# Patient Record
Sex: Female | Born: 1995
Health system: Southern US, Community
[De-identification: ages and names within clinical notes are randomized; demographics above are authoritative.]

## PROBLEM LIST (undated history)

## (undated) DIAGNOSIS — T7840XA Allergy, unspecified, initial encounter: Secondary | ICD-10-CM

## (undated) HISTORY — PX: WISDOM TOOTH EXTRACTION: SHX21

## (undated) HISTORY — DX: Allergy, unspecified, initial encounter: T78.40XA

---
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
## 2016-09-07 DIAGNOSIS — D225 Melanocytic nevi of trunk: Secondary | ICD-10-CM | POA: Diagnosis not present

## 2016-09-07 DIAGNOSIS — D224 Melanocytic nevi of scalp and neck: Secondary | ICD-10-CM | POA: Diagnosis not present

## 2016-10-04 DIAGNOSIS — D224 Melanocytic nevi of scalp and neck: Secondary | ICD-10-CM | POA: Diagnosis not present

## 2016-10-10 DIAGNOSIS — Z6823 Body mass index (BMI) 23.0-23.9, adult: Secondary | ICD-10-CM | POA: Diagnosis not present

## 2016-10-10 DIAGNOSIS — Z01419 Encounter for gynecological examination (general) (routine) without abnormal findings: Secondary | ICD-10-CM | POA: Diagnosis not present

## 2016-10-10 LAB — HM PAP SMEAR: HM Pap smear: NEGATIVE

## 2017-03-22 DIAGNOSIS — J45909 Unspecified asthma, uncomplicated: Secondary | ICD-10-CM | POA: Diagnosis not present

## 2017-04-08 ENCOUNTER — Ambulatory Visit: Payer: Self-pay | Admitting: Family Medicine

## 2017-04-17 ENCOUNTER — Encounter: Payer: Self-pay | Admitting: Family Medicine

## 2017-04-17 ENCOUNTER — Ambulatory Visit (INDEPENDENT_AMBULATORY_CARE_PROVIDER_SITE_OTHER): Payer: 59 | Admitting: Family Medicine

## 2017-04-17 VITALS — BP 130/84 | HR 101 | Ht 67.25 in | Wt 150.0 lb

## 2017-04-17 DIAGNOSIS — M76899 Other specified enthesopathies of unspecified lower limb, excluding foot: Secondary | ICD-10-CM

## 2017-04-17 DIAGNOSIS — J45990 Exercise induced bronchospasm: Secondary | ICD-10-CM | POA: Diagnosis not present

## 2017-04-17 MED ORDER — MELOXICAM 7.5 MG PO TABS: 7.5000 mg | ORAL_TABLET | Freq: Every day | ORAL | 0 refills | Status: DC

## 2017-04-17 NOTE — Progress Notes (Signed)
Subjective:  Katie Oliver is a 21 y.o. female who presents today with a chief complaint of the pain and to establish care.   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  She was to go to graduate school afterwards.  Was to social work.  She is currently home and things can work.  Knee pain, new issue Symptoms started about 3 weeks ago.  Stable over that time.  She is in ballet class at school, however does not have any other obvious precipitating events.  No trauma   Or injury.  Pain is located in the superior anterior aspect of her knee.  No locking, popping, or catching.  No swelling.  No treatments tried.  Shortness of breath, new issue Patient had an episode about a month ago of increased shortness of breath while hiking.  She was seen in a different office and was given a prescription for inhaler.  She has not yet started this.  She did have some chest tightness and decreased airflow with episode.  No chest pain.  No cough.  She has not tried any other treatments for this.  She does report a several year history of increased shortness of breath with exercise.  She has never been diagnosed with asthma in the past.  ROS: Per HPI, otherwise a 14 point review of systems was performed and was negative  PMH:  The following were reviewed and entered/updated in epic: Past Medical History:  Diagnosis Date  . Allergy    Patient Active Problem List   Diagnosis Date Noted  . Exercise-induced asthma 04/17/2017   Past Surgical History:  Procedure Laterality Date  . WISDOM TOOTH EXTRACTION     Family History  Problem Relation Age of Onset  . Cancer Maternal Grandmother        Skin   . Cancer Maternal Grandfather        Prostate  . Heart attack Paternal Grandfather   . Diabetes Paternal Grandfather     Medications- reviewed and updated Current Outpatient Medications  Medication Sig Dispense Refill  . JUNEL FE 1/20 1-20 MG-MCG  tablet Take 1 tablet by mouth daily.  3  . meloxicam (MOBIC) 7.5 MG tablet Take 1 tablet (7.5 mg total) by mouth daily. 30 tablet 0  . VENTOLIN HFA 108 (90 Base) MCG/ACT inhaler      No current facility-administered medications for this visit.    Allergies-reviewed and updated No Known Allergies  Social History   Socioeconomic History  . Marital status: Single    Spouse name: None  . Number of children: None  . Years of education: None  . Highest education level: None  Social Needs  . Financial resource strain: None  . Food insecurity - worry: None  . Food insecurity - inability: None  . Transportation needs - medical: None  . Transportation needs - non-medical: None  Occupational History  . Occupation: Ship broker   Tobacco Use  . Smoking status: Never Smoker  . Smokeless tobacco: Never Used  Substance and Sexual Activity  . Alcohol use: No    Frequency: Never  . Drug use: No  . Sexual activity: Yes  Other Topics Concern  . None  Social History Narrative  . None   Objective:  Physical Exam: BP 130/84   Pulse (!) 101   Ht 5' 7.25" (1.708 m)   Wt 150 lb (68 kg)   BMI 23.32 kg/m   Gen: NAD, resting  comfortably CV: RRR with no murmurs appreciated Pulm: NWOB, CTAB with no crackles, wheezes, or rhonchi GI: Normal bowel sounds present. Soft, Nontender, Nondistended. MSK:  -Right knee: No deformities.  Full range of motion.  No crepitus.  Tender to palpation along superior edge patella.  Anterior and posterior drawer signs negative.  Stable to varus and valgus stress.  Strength 5 out of 5 in all directions.  Negative patellar apprehension test. Skin: Warm, dry Neuro: Grossly normal, moves all extremities Psych: Normal affect and thought content  Assessment/Plan:  Quadricep tendinitis Start meloxicam 7.5 mg daily for the next 2 weeks.  Discussed home exercise program focused on strengthening quadricep muscle and tendon.  Advised compression and ice.  Discussed avoidance  of activity that worsens pain.  Return precautions reviewed.  Follow-up as needed.  Exercise-induced asthma Based on history.  Her exam is completely normal.  She does not have PFTs for formal diagnosis.  Advised patient to use albuterol inhaler as needed about 30 minutes prior to activity.  Patient can return for PFTs at her discretion-do not need urgently as it will not change management.  Return precautions reviewed.  Follow-up as needed.  Algis Greenhouse. Jerline Pain, MD 04/17/2017 2:39 PM

## 2017-04-17 NOTE — Patient Instructions (Signed)
Use the meloxicam for the next 2 weeks.  Take 1 pill daily.  Not take any ibuprofen or naproxen while on this.  Do the exercises we discussed.  Use the albuterol prior to exercise.  We can do a lung function test in the future for a formal diagnosis of asthma.  He can use compression under knee as needed.  Take care, Dr. Jerline Pain

## 2017-05-13 ENCOUNTER — Ambulatory Visit (INDEPENDENT_AMBULATORY_CARE_PROVIDER_SITE_OTHER): Payer: 59 | Admitting: Family Medicine

## 2017-05-13 DIAGNOSIS — Z23 Encounter for immunization: Secondary | ICD-10-CM

## 2017-05-13 DIAGNOSIS — Z111 Encounter for screening for respiratory tuberculosis: Secondary | ICD-10-CM

## 2017-05-15 ENCOUNTER — Ambulatory Visit: Payer: 59

## 2017-05-15 ENCOUNTER — Encounter: Payer: Self-pay | Admitting: Family Medicine

## 2017-05-15 DIAGNOSIS — Z111 Encounter for screening for respiratory tuberculosis: Secondary | ICD-10-CM | POA: Diagnosis not present

## 2017-05-15 LAB — TB SKIN TEST
Induration: NEGATIVE mm
TB Skin Test: NEGATIVE

## 2017-05-15 NOTE — Progress Notes (Signed)
Normal/negative PPD read

## 2017-05-15 NOTE — Progress Notes (Signed)
I have reviewed the patient's encounter and agree with the documentation.  Algis Greenhouse. Jerline Pain, MD 05/15/2017 9:25 AM

## 2017-05-15 NOTE — Progress Notes (Signed)
PPD read, normal.

## 2017-05-23 NOTE — Progress Notes (Signed)
Patient needing PPD placement for work purposes.  PPD placed.  Patient tolerated well.

## 2017-06-25 ENCOUNTER — Encounter: Payer: Self-pay | Admitting: Physical Therapy

## 2017-07-31 ENCOUNTER — Telehealth: Payer: Self-pay | Admitting: Family Medicine

## 2017-07-31 NOTE — Telephone Encounter (Signed)
Per [facility] the patient called to check if her Immunization form was ready for pick up. She dropped it off a couple days ago.

## 2017-08-01 NOTE — Telephone Encounter (Signed)
Form has been filled out and signed.  Placed at front for pick up by patient or one of her parents.  Patient verbalized permission for a parent to pick up.

## 2017-10-14 DIAGNOSIS — Z01419 Encounter for gynecological examination (general) (routine) without abnormal findings: Secondary | ICD-10-CM | POA: Diagnosis not present

## 2017-10-14 DIAGNOSIS — Z6823 Body mass index (BMI) 23.0-23.9, adult: Secondary | ICD-10-CM | POA: Diagnosis not present

## 2017-10-24 DIAGNOSIS — M25571 Pain in right ankle and joints of right foot: Secondary | ICD-10-CM | POA: Diagnosis not present

## 2018-03-14 ENCOUNTER — Encounter: Payer: Self-pay | Admitting: Family Medicine

## 2018-03-14 ENCOUNTER — Ambulatory Visit (INDEPENDENT_AMBULATORY_CARE_PROVIDER_SITE_OTHER): Payer: 59 | Admitting: Family Medicine

## 2018-03-14 VITALS — BP 132/80 | HR 85 | Temp 98.3°F | Wt 149.0 lb

## 2018-03-14 DIAGNOSIS — J310 Chronic rhinitis: Secondary | ICD-10-CM

## 2018-03-14 DIAGNOSIS — M2550 Pain in unspecified joint: Secondary | ICD-10-CM | POA: Diagnosis not present

## 2018-03-14 DIAGNOSIS — Z7184 Encounter for health counseling related to travel: Secondary | ICD-10-CM | POA: Diagnosis not present

## 2018-03-14 DIAGNOSIS — R109 Unspecified abdominal pain: Secondary | ICD-10-CM | POA: Insufficient documentation

## 2018-03-14 LAB — COMPREHENSIVE METABOLIC PANEL
ALT: 13 U/L (ref 0–35)
AST: 14 U/L (ref 0–37)
Albumin: 4.2 g/dL (ref 3.5–5.2)
Alkaline Phosphatase: 56 U/L (ref 39–117)
BUN: 12 mg/dL (ref 6–23)
CHLORIDE: 106 meq/L (ref 96–112)
CO2: 28 meq/L (ref 19–32)
CREATININE: 1.04 mg/dL (ref 0.40–1.20)
Calcium: 9.5 mg/dL (ref 8.4–10.5)
GFR: 70.03 mL/min (ref >60.00)
Glucose, Bld: 84 mg/dL (ref 70–99)
POTASSIUM: 4.6 meq/L (ref 3.5–5.1)
SODIUM: 141 meq/L (ref 135–145)
Total Bilirubin: 0.6 mg/dL (ref 0.2–1.2)
Total Protein: 6.8 g/dL (ref 6.0–8.3)

## 2018-03-14 LAB — CBC
HEMATOCRIT: 40.3 % (ref 36.0–46.0)
Hemoglobin: 13.6 g/dL (ref 12.0–15.0)
MCHC: 33.8 g/dL (ref 30.0–36.0)
MCV: 87.7 fl (ref 78.0–100.0)
Platelets: 351 10*3/uL (ref 150.0–400.0)
RBC: 4.59 Mil/uL (ref 3.87–5.11)
RDW: 12.5 % (ref 11.5–15.5)
WBC: 5.9 10*3/uL (ref 4.0–10.5)

## 2018-03-14 LAB — SEDIMENTATION RATE: Sed Rate: 1 mm/hr (ref 0–20)

## 2018-03-14 LAB — C-REACTIVE PROTEIN: CRP: 0.3 mg/dL — AB (ref 0.5–20.0)

## 2018-03-14 LAB — TSH: TSH: 1.59 u[IU]/mL (ref 0.35–4.50)

## 2018-03-14 MED ORDER — AZITHROMYCIN 250 MG PO TABS: ORAL_TABLET | ORAL | 0 refills | Status: DC

## 2018-03-14 MED ORDER — TYPHOID VACCINE PO CPDR: 1.0000 | DELAYED_RELEASE_CAPSULE | ORAL | 0 refills | Status: DC

## 2018-03-14 NOTE — Progress Notes (Signed)
   Subjective:  Katie Oliver is a 22 y.o. female who presents today with a chief complaint of polyarthralgia.   HPI:   Polyarthralgia, new problem Several year history.  Has had issues of "discomfort" in several joints including shoulders, hips, knees, back, and chest.  Does not currently have any symptoms.  Symptoms usually last for a day or so and then subside.  Occasionally the areas feel warm.  She has not tried any medications for this.  No fevers or chills.  No weight loss.  Chronic rhinitis Several year history.  Occasionally gets sinus infections once or twice per year.  Like to see an allergist for allergy testing.  Abdominal pain Also several year history.  Worse with certain foods.  Pain improves with bowel movement.  No nausea or vomiting.  No constipation or diarrhea.  Travel medicine Patient will be going to [place] 2 months.  She will be there for a week.  ROS: Per HPI  PMH: She reports that she has never smoked. She has never used smokeless tobacco. She reports that she does not drink alcohol or use drugs.  Objective:  Physical Exam: BP 132/80 (BP Location: Right Arm, Patient Position: Sitting, Cuff Size: Normal)   Pulse 85   Temp 98.3 F (36.8 C) (Oral)   Wt 149 lb (67.6 kg)   SpO2 100%   BMI 23.16 kg/m   Gen: NAD, resting comfortably CV: RRR with no murmurs appreciated Pulm: NWOB, CTAB with no crackles, wheezes, or rhonchi GI: Normal bowel sounds present. Soft, Nontender, Nondistended. MSK: No edema, cyanosis, or clubbing noted Skin: Warm, dry Neuro: Grossly normal, moves all extremities Psych: Normal affect and thought content   Assessment/Plan:  Polyarthralgia No red flag signs or symptoms.  Mother is concerned about autoimmune disorder.  Check CBC, CMET, ANA, CRP, rheumatoid factor, TSH, and sedimentation rate.  Chronic rhinitis Continue over-the-counter antihistamines as needed.  Will place referral to allergy for allergy testing, per patient  request.  Abdominal pain Likely secondary to IBS.  No red flags.  Travel Advice Typhoid vaccine sent into her pharmacy.  We will also send in Z-Pak for traveler's diarrhea.  Patient declined malaria prophylaxis.  Algis Greenhouse. Jerline Pain, MD 03/14/2018 12:27 PM

## 2018-03-14 NOTE — Assessment & Plan Note (Signed)
Likely secondary to IBS.  No red flags.

## 2018-03-14 NOTE — Patient Instructions (Signed)
It was very nice to see you today!  We will check blood work today and send in a referral to the allergist.  Please take the typhoid vaccine.  Please use the azithromycin if you get traveler's diarrhea. You can also use immodium.   Take care, Dr Jerline Pain

## 2018-03-14 NOTE — Assessment & Plan Note (Signed)
No red flag signs or symptoms.  Mother is concerned about autoimmune disorder.  Check CBC, CMET, ANA, CRP, rheumatoid factor, TSH, and sedimentation rate.

## 2018-03-14 NOTE — Assessment & Plan Note (Signed)
Continue over-the-counter antihistamines as needed.  Will place referral to allergy for allergy testing, per patient request.

## 2018-03-17 LAB — RHEUMATOID FACTOR: Rhuematoid fact SerPl-aCnc: <14 IU/mL (ref <14)

## 2018-03-17 LAB — ANA: Anti Nuclear Antibody(ANA): NEGATIVE

## 2018-03-17 NOTE — Progress Notes (Signed)
Please inform patient of the following:  Please let patient know that all of her inflammatory markers were negative. I do not have a clear explanation for her joint pain based on these results. I would like for her to see sports medicine for further evaluation and management.  Algis Greenhouse. Jerline Pain, MD 03/17/2018 3:35 PM

## 2018-03-18 ENCOUNTER — Other Ambulatory Visit: Payer: Self-pay

## 2018-03-18 DIAGNOSIS — M2550 Pain in unspecified joint: Secondary | ICD-10-CM

## 2018-03-18 NOTE — Progress Notes (Signed)
Referral

## 2018-03-27 DIAGNOSIS — J3081 Allergic rhinitis due to animal (cat) (dog) hair and dander: Secondary | ICD-10-CM | POA: Diagnosis not present

## 2018-03-27 DIAGNOSIS — J4520 Mild intermittent asthma, uncomplicated: Secondary | ICD-10-CM | POA: Diagnosis not present

## 2018-03-27 DIAGNOSIS — T781XXA Other adverse food reactions, not elsewhere classified, initial encounter: Secondary | ICD-10-CM | POA: Diagnosis not present

## 2018-04-21 ENCOUNTER — Encounter: Payer: Self-pay | Admitting: Sports Medicine

## 2018-04-21 ENCOUNTER — Ambulatory Visit (INDEPENDENT_AMBULATORY_CARE_PROVIDER_SITE_OTHER): Payer: 59 | Admitting: Sports Medicine

## 2018-04-21 VITALS — BP 100/70 | HR 78 | Ht 67.25 in | Wt 143.2 lb

## 2018-04-21 DIAGNOSIS — M2550 Pain in unspecified joint: Secondary | ICD-10-CM

## 2018-04-21 DIAGNOSIS — M24559 Contracture, unspecified hip: Secondary | ICD-10-CM | POA: Diagnosis not present

## 2018-04-21 DIAGNOSIS — R29898 Other symptoms and signs involving the musculoskeletal system: Secondary | ICD-10-CM

## 2018-04-21 NOTE — Patient Instructions (Addendum)
Also check out [employer]" which is a program developed by Dr. Minerva Ends.   There are links to a couple of his YouTube Videos below and I would like to see performing one of his videos 5-6 days per week.    A good intro video is: "Independence from Pain 7-minute Video" - [email or web address]   Exercises that focus more on the neck are as below: Dr. Archie Balboa with [employer] teaching neck and shoulder details Part 1 - https://youtu.be/[email or web address] Part 2 Dr. Archie Balboa with [facility] quick routine to practice daily - https://[email or web address].be/[email or web address]  Do not try to attempt the entire video when first beginning.    Try breaking of each exercise that he goes into shorter segments.  Otherwise if they perform an exercise for 45 seconds, start with 15 seconds and rest and then resume when they begin the new activity.  If you work your way up to being able to do these videos without having to stop, I expect you will see significant improvements in your pain.  If you enjoy his videos and would like to find out more you can look on his website: [email or web address]  He has a workout streaming option as well as a DVD set available for purchase.  Amazon has the best price for his DVDs.     Please perform the exercise program that we have prepared for you and gone over in detail on a daily basis.  In addition to the handout you were provided you can access your program through: [email or web address]   Your unique program code is: [email or web address]

## 2018-04-21 NOTE — Assessment & Plan Note (Addendum)
Patient has had a fairly extensive work-up in the past both with results letter in epic as well as [facility] allergy specialist.  She has had a CT scan that did have suggestion of celiac disease based on CT findings however reportedly she had a negative serologic work-up.  She has markedly tight hip flexors and weak hip abductor's on exam and this is likely contributing to the majority of her back hip and knee symptoms however the remaining systemic features as well as the weather sensitivity does suggest a likely underlying autoimmune chronic inflammation component.  She has cut out gluten in the last 3 weeks and had almost immediate resolution in her GI symptoms but continues to have a musculoskeletal complaints.  Discussed the underlying features of tight hip flexors leading to crouched, fetal like position that results in spinal column compression.  Including lumbar hyperflexion with hypermobility, thoracic flexion with restrictive rotation and cervical lordosis reversal   I suspect with continued therapeutic exercises, dietary changes including avoidance of this may reflect more gluten to to the post true gluten allergy her symptoms go to bed.  Patient was advised that her underlying systemic symptoms do not correlate with any true systemic condition that has any effect on morbidity or mortality.  We will plan to check in with her in 6 weeks to ensure that she is continuing to improve.  Can consider osteopathic manipulation if any focal symptoms however given generalized findings will begin with home exercise program.

## 2018-04-21 NOTE — Progress Notes (Signed)
Katie Oliver at [place] [facility]  Katie Oliver - 22 y.o. female MRN [id number]  Date of birth: 07-23-1995  Visit Date: 04/21/2018  PCP: Vivi Barrack, MD   Referred by: Vivi Barrack, MD   Scribe(s) for today's visit: Wendy Poet, LAT, ATC  SUBJECTIVE:  Katie Oliver is here for New Patient (Initial Visit) ( Back and B hip and knee pain / polyarthralgia)  Referred by: Dr. Jerline Pain  HPI: Her B hip and knee symptoms INITIALLY: Began a few years ago w/ no known MOI Described as mild aching pain that can increase to moderate, sharp, nonradiating.  She also notes a generalized joint stiffness particularly when it's cold and rainy. Worsened with rainy and cold weather and activity Improved with nothing noted Additional associated symptoms include: no N/T noted in B LEs; some intermittent mechanical symptoms w/ activity    At this time symptoms show no change compared to onset  She has not been doing any particular treatments for her hips or knees.  Her back symptoms INITIALLY: Began years ago w/ no known MOI.  Sometimes she feels a hot, burning-type pain and sometimes it's more of a soreness or a stiffness. Described as moderate pain that varies in nature from hot and burning to sore/stiff, nonradiating Worsened with prolonged sitting or standing Improved with heat and topical rubs Additional associated symptoms include: no N/T and no radiating pain noted in B LEs    At this time symptoms are worsening compared to onset, especially over the past year. She has tried heat and topical rubs w/ no lasting relief.  REVIEW OF SYSTEMS: Reports night time disturbances.  Difficulty falling asleep. Denies fevers, chills, or night sweats. Denies unexplained weight loss. Denies personal history of cancer. Denies changes in bowel or bladder habits. Denies recent unreported falls. Reports new or worsening dyspnea or  wheezing.  Pt has asthma. Denies headaches or dizziness.  Denies numbness, tingling or weakness  In the extremities.  Denies dizziness or presyncopal episodes Denies lower extremity edema    HISTORY:  Prior history reviewed and updated per electronic medical record.  Social History   Occupational History  . Occupation: Ship broker   Tobacco Use  . Smoking status: Never Smoker  . Smokeless tobacco: Never Used  Substance and Sexual Activity  . Alcohol use: No    Frequency: Never  . Drug use: No  . Sexual activity: Yes   Social History   Social History Narrative  . Not on file    Past Medical History:  Diagnosis Date  . Allergy    Past Surgical History:  Procedure Laterality Date  . WISDOM TOOTH EXTRACTION     family history includes Cancer in her maternal grandfather and maternal grandmother; Diabetes in her paternal grandfather; Heart attack in her paternal grandfather.  DATA OBTAINED & REVIEWED:  No results for input(s): HGBA1C, LABURIC, CREATINE in the last 8760 hours. Problem  Polyarthralgia   Reportedly normal Celiac Disease Workup and negative Auto-immune workup.      .   OBJECTIVE:  VS:  HT:5' 7.25" (170.8 cm)   WT:143 lb 3.2 oz (65 kg)  BMI:22.27    BP:100/70  HR:78bpm  TEMP: ( )  RESP:98 %   PHYSICAL EXAM: CONSTITUTIONAL: Well-developed, Well-nourished and In no acute distress PSYCHIATRIC: Alert & appropriately interactive. and Not depressed or anxious appearing. RESPIRATORY: No increased work of breathing and Trachea Midline EYES: Pupils are equal., EOM  intact without nystagmus. and No scleral icterus.  VASCULAR EXAM: Warm and well perfused NEURO: unremarkable  MSK Exam: BACK Exam: Normal alignment & Contours Skin: No overlying erythema/ecchymosis  MOTOR TESTING: Intact in all LE myotomes and Able to heel and toe walk without difficutly        RIGHT    LEFT Straight leg raise-------------------------: normal, no pain                          normal, no pain Braggard Stretch Test------------------: normal, no pain                         normal, no pain Slump Sign--------------------------------: normal, no pain                         normal, no pain Popliteal compression test------------: normal, no pain                         normal, no pain Greater sciatic notch tenderness----: normal, no pain                         normal, no pain  She does have markedly tight hip flexors bilaterally with weak hip abductor's with the tensor fascia lata predominant recruitment pattern.   ASSESSMENT   1. Polyarthralgia   2. Weakness of both hips   3. Hip flexor tightness, unspecified laterality     PLAN:  Pertinent additional documentation may be included in corresponding procedure notes, imaging studies, problem based documentation and patient instructions.  Procedures:  . Discussed the foundation of treatment for this condition is physical therapy and/or daily (5-6 days/week) therapeutic exercises, focusing on core strengthening, coordination, neuromuscular control/reeducation.  Therapeutic exercises prescribed per procedure note.  Medications:  No orders of the defined types were placed in this encounter.  Discussion/Instructions: Polyarthralgia Patient has had a fairly extensive work-up in the past both with results letter in epic as well as [facility] allergy specialist.  She has had a CT scan that did have suggestion of celiac disease based on CT findings however reportedly she had a negative serologic work-up.  She has markedly tight hip flexors and weak hip abductor's on exam and this is likely contributing to the majority of her back hip and knee symptoms however the remaining systemic features as well as the weather sensitivity does suggest a likely underlying autoimmune chronic inflammation component.  She has cut out gluten in the last 3 weeks and had almost immediate resolution in her GI symptoms but continues to  have a musculoskeletal complaints.  Discussed the underlying features of tight hip flexors leading to crouched, fetal like position that results in spinal column compression.  Including lumbar hyperflexion with hypermobility, thoracic flexion with restrictive rotation and cervical lordosis reversal   I suspect with continued therapeutic exercises, dietary changes including avoidance of this may reflect more gluten to to the post true gluten allergy her symptoms go to bed.  Patient was advised that her underlying systemic symptoms do not correlate with any true systemic condition that has any effect on morbidity or mortality.  We will plan to check in with her in 6 weeks to ensure that she is continuing to improve.  Can consider osteopathic manipulation if any focal symptoms however given generalized findings will begin with home exercise program.  .  Links to [employer] provided today per Patient Instructions.  These exercises were developed by Minerva Ends, [place] with a strong emphasis on core neuromuscular reducation and postural realignment through body-weight exercises. . Discussed red flag symptoms that warrant earlier emergent evaluation and patient voices understanding. . Activity modifications and the importance of avoiding exacerbating activities (limiting pain to no more than a 4 / 10 during or following activity) recommended and discussed. . >50% of this 30 minutes minute visit spent in direct patient counseling and/or coordination of care. Discussion was focused on education regarding the in discussing the pathoetiology and anticipated clinical course of the above condition.  Follow-up:  . Return in about 6 weeks (around 06/02/2018).  . If any lack of improvement: consider further diagnostic evaluation with Repeat celiac work-up including potential for referral back to gastroenterology. . At follow up will plan: To consider osteopathic manipulation     CMA/ATC served as  scribe during this visit. History, Physical, and Plan performed by medical provider. Documentation and orders reviewed and attested to.      Gerda Diss, [facility] Physician

## 2018-04-26 DIAGNOSIS — Z23 Encounter for immunization: Secondary | ICD-10-CM | POA: Diagnosis not present

## 2018-05-05 DIAGNOSIS — L02425 Furuncle of right lower limb: Secondary | ICD-10-CM | POA: Diagnosis not present

## 2018-05-26 ENCOUNTER — Ambulatory Visit (INDEPENDENT_AMBULATORY_CARE_PROVIDER_SITE_OTHER): Payer: 59 | Admitting: Sports Medicine

## 2018-05-26 ENCOUNTER — Encounter: Payer: Self-pay | Admitting: Sports Medicine

## 2018-05-26 VITALS — BP 102/70 | HR 90 | Ht 67.25 in | Wt 143.2 lb

## 2018-05-26 DIAGNOSIS — M9904 Segmental and somatic dysfunction of sacral region: Secondary | ICD-10-CM

## 2018-05-26 DIAGNOSIS — M2550 Pain in unspecified joint: Secondary | ICD-10-CM

## 2018-05-26 DIAGNOSIS — M9903 Segmental and somatic dysfunction of lumbar region: Secondary | ICD-10-CM | POA: Diagnosis not present

## 2018-05-26 DIAGNOSIS — M9902 Segmental and somatic dysfunction of thoracic region: Secondary | ICD-10-CM

## 2018-05-26 DIAGNOSIS — M9901 Segmental and somatic dysfunction of cervical region: Secondary | ICD-10-CM

## 2018-05-26 DIAGNOSIS — M9905 Segmental and somatic dysfunction of pelvic region: Secondary | ICD-10-CM

## 2018-05-26 DIAGNOSIS — M24551 Contracture, right hip: Secondary | ICD-10-CM

## 2018-05-26 DIAGNOSIS — M9908 Segmental and somatic dysfunction of rib cage: Secondary | ICD-10-CM | POA: Diagnosis not present

## 2018-05-26 DIAGNOSIS — R29898 Other symptoms and signs involving the musculoskeletal system: Secondary | ICD-10-CM

## 2018-05-26 NOTE — Progress Notes (Deleted)
PROCEDURE NOTE : OSTEOPATHIC MANIPULATION The decision today to treat with Osteopathic Manipulative Therapy (OMT) was based on physical exam findings. Verbal consent was obtained following a discussion with the patient regarding the of risks, benefits and potential side effects, including an acute pain flare,post manipulation soreness and need for repeat treatments. {Blank single:19197::"Additionally, we specifically discussed the minimal risk of  injury to neurovascular structures associated with Cervical manipulation."," "}   {Contraindications to OMT:21555::"NONE"}  Manipulation was performed as below: Regions Treated & Osteopathic Exam Findings  {-LBSMOMTFINDINGS:21144}   OMT Techniques Used  {OMT Techniques Used:21101::"HVLA","muscle energy","myofascial release"}    The patient tolerated the treatment well and reported {CHL AMB ORT SYMPTOMS POST TREATMENT:21798::"Improved"} symptoms following treatment today. Patient was given medications, exercises, stretches and lifestyle modifications per AVS and verbally.

## 2018-05-26 NOTE — Progress Notes (Signed)
Katie Oliver  Katie Oliver - 22 y.o. female MRN [id number]  Date of birth: 09-Mar-1996  Visit Date: 05/26/2018  PCP: Vivi Barrack, MD   Referred by: Vivi Barrack, MD   SUBJECTIVE:  Chief Complaint  Patient presents with  . Follow-up    Polyarthralgia w/ specific c/o B hip and knee pain     HPI: Patient is here for follow-up of bilateral hip and knee pain.  She recently traveled to [place] and did have a flareup while she was there after traveling and not doing home exercises as prescribed and changing her diet.  Since returning she is actually been feeling better again.  Pain is moderate.  She has stopped eating gluten since returning from her trip and this is been helpful.  Her symptoms are worsened with rain cold prolonged sitting and standing.  She has not been performing the foundations training videos but has been doing the therapeutic exercises.  REVIEW OF SYSTEMS: Denies fevers, chills, recent weight gain or weight loss.  No night sweats. No significant nighttime awakenings due to this issue.  HISTORY:  Prior history reviewed and updated per electronic medical record.  Social History   Occupational History  . Occupation: Ship broker   Tobacco Use  . Smoking status: Never Smoker  . Smokeless tobacco: Never Used  Substance and Sexual Activity  . Alcohol use: No    Frequency: Never  . Drug use: No  . Sexual activity: Yes   Social History   Social History Narrative  . Not on file      OBJECTIVE:  VS:  HT:5' 7.25" (170.8 cm)   WT:143 lb 3.2 oz (65 kg)  BMI:22.27    BP:102/70  HR:90bpm  TEMP: ( )  RESP:97 %   PHYSICAL EXAM: Adult female. No acute distress.  Alert and appropriate. Good cervical and lumbar range of motion although there are functional limitations. Negative Spurling's compression test and Lhermitte's compression test.   Upper extremity lower extremity  strength is 5/5 in all myotomes. Normal sensation Significant anterior chain dominant posture.    ASSESSMENT   1. Somatic dysfunction of cervical region   2. Somatic dysfunction of thoracic region   3. Somatic dysfunction of lumbar region   4. Somatic dysfunction of rib cage region   5. Somatic dysfunction of pelvis region   6. Somatic dysfunction of sacral region   7. Right hip flexor tightness   8. Polyarthralgia   9. Weakness of both hips     PLAN:  Pertinent additional documentation may be included in corresponding procedure notes, imaging studies, problem based documentation and patient instructions.  Procedures:  PROCEDURE NOTE : OSTEOPATHIC MANIPULATION The decision today to treat with Osteopathic Manipulative Therapy (OMT) was based on physical exam findings. Verbal consent was obtained following a discussion with the patient regarding the of risks, benefits and potential side effects, including an acute pain flare,post manipulation soreness and need for repeat treatments. Additionally, we specifically discussed the minimal risk of  injury to neurovascular structures associated with Cervical manipulation.   Contraindications to OMT: NONE  Manipulation was performed as below: Regions Treated & Osteopathic Exam Findings  CERVICAL SPINE: OA - rotated right C6 Extended, rotated LEFT, sidebent RIGHT THORACIC SPINE:  T4 - 8 Neutral, rotated LEFT, sidebent RIGHT RIBS:  Rib 7 Right  Posterior LUMBAR SPINE:  L4 FRS RIGHT (Flexed, Rotated & Sidebent) PELVIS:  Right psoas spasm  Right anterior innonimate SACRUM:  L on L sacral torsion   OMT Techniques Used  HVLA muscle energy myofascial release HVLA - Long Lever    The patient tolerated the treatment well and reported Improved symptoms following treatment today. Patient was given medications, exercises, stretches and lifestyle modifications per AVS and verbally.    Medications:  No orders of the defined types were  placed in this encounter.   Discussion/Instructions: No problem-specific Assessment & Plan notes found for this encounter.  THERAPEUTIC EXERCISE: Discussed the foundation of treatment for this condition is physical therapy and/or daily (5-6 days/week) therapeutic exercises, focusing on core strengthening, coordination, neuromuscular control/reeducation.  Links to [employer] provided today per Patient Instructions.  These exercises were developed by Minerva Ends, DC with a strong emphasis on core neuromuscular reducation and postural realignment through body-weight exercises.  Continue previously prescribed home exercise program.   If any lack of improvement: consider further diagnostic evaluation with Further diagnostic testing  At follow up will plan: repeat osteopathic manipulation Return in about 6 weeks (around 07/07/2018) for consideration of repeat Osteopathic Manipulation.          Gerda Diss, [facility] Physician

## 2018-07-05 ENCOUNTER — Encounter: Payer: Self-pay | Admitting: Sports Medicine

## 2018-07-08 ENCOUNTER — Ambulatory Visit: Payer: 59 | Admitting: Sports Medicine

## 2018-07-30 DIAGNOSIS — R42 Dizziness and giddiness: Secondary | ICD-10-CM | POA: Diagnosis not present

## 2018-07-30 DIAGNOSIS — R102 Pelvic and perineal pain: Secondary | ICD-10-CM | POA: Diagnosis not present

## 2018-07-30 DIAGNOSIS — R350 Frequency of micturition: Secondary | ICD-10-CM | POA: Diagnosis not present

## 2018-08-08 ENCOUNTER — Other Ambulatory Visit: Payer: Self-pay | Admitting: Family Medicine

## 2018-08-08 NOTE — Telephone Encounter (Signed)
Copied from [place] [phone]. Topic: Quick Communication - Rx Refill/Question >> Aug 08, 2018  4:18 PM Sheran Luz wrote: Medication: VENTOLIN HFA 108 (90 Base) MCG/ACT inhaler   Patient is requesting a refill of this medication. It was prescribed by previous PCP.   Preferred Pharmacy (with phone number or street name):[id number] [address] [place] [address]  [phone] (Phone) [phone] (Fax)

## 2018-08-08 NOTE — Telephone Encounter (Signed)
Requested medication (s) are due for refill today: yes  Requested medication (s) are on the active medication list: yes  Last refill:  Last refilled by historical provider  Future visit scheduled: No  Notes to clinic:  Last prescribed by previous PCP    Requested Prescriptions  Pending Prescriptions Disp Refills   VENTOLIN HFA 108 (90 Base) MCG/ACT inhaler       Pulmonology:  Beta Agonists Failed - 08/08/2018  4:44 PM      Failed - One inhaler should last at least one month. If the patient is requesting refills earlier, contact the patient to check for uncontrolled symptoms.      Passed - Valid encounter within last 12 months    Recent Outpatient Visits          2 months ago Somatic dysfunction of cervical region   [name], [place] D, DO   3 months ago [place], [place] D, DO   4 months ago Polyarthralgia   [facility] Roni Bread, Algis Greenhouse, MD   1 year ago Encounter for PPD test   [facility] Roni Bread, Algis Greenhouse, MD   1 year ago Quadriceps tendinitis   [facility] [address] Algis Greenhouse, MD

## 2018-08-11 MED ORDER — VENTOLIN HFA 108 (90 BASE) MCG/ACT IN AERS: 2.0000 | INHALATION_SPRAY | RESPIRATORY_TRACT | 3 refills | Status: AC | PRN

## 2018-08-11 NOTE — Telephone Encounter (Signed)
See note

## 2018-09-15 DIAGNOSIS — M546 Pain in thoracic spine: Secondary | ICD-10-CM | POA: Diagnosis not present

## 2018-09-15 DIAGNOSIS — M2560 Stiffness of unspecified joint, not elsewhere classified: Secondary | ICD-10-CM | POA: Diagnosis not present

## 2018-09-15 DIAGNOSIS — R293 Abnormal posture: Secondary | ICD-10-CM | POA: Diagnosis not present

## 2018-09-17 DIAGNOSIS — M546 Pain in thoracic spine: Secondary | ICD-10-CM | POA: Diagnosis not present

## 2018-09-17 DIAGNOSIS — R293 Abnormal posture: Secondary | ICD-10-CM | POA: Diagnosis not present

## 2018-09-17 DIAGNOSIS — M2560 Stiffness of unspecified joint, not elsewhere classified: Secondary | ICD-10-CM | POA: Diagnosis not present

## 2018-09-22 DIAGNOSIS — M546 Pain in thoracic spine: Secondary | ICD-10-CM | POA: Diagnosis not present

## 2018-09-22 DIAGNOSIS — M2560 Stiffness of unspecified joint, not elsewhere classified: Secondary | ICD-10-CM | POA: Diagnosis not present

## 2018-09-22 DIAGNOSIS — R293 Abnormal posture: Secondary | ICD-10-CM | POA: Diagnosis not present

## 2018-09-24 DIAGNOSIS — M546 Pain in thoracic spine: Secondary | ICD-10-CM | POA: Diagnosis not present

## 2018-09-24 DIAGNOSIS — R293 Abnormal posture: Secondary | ICD-10-CM | POA: Diagnosis not present

## 2018-09-24 DIAGNOSIS — M2560 Stiffness of unspecified joint, not elsewhere classified: Secondary | ICD-10-CM | POA: Diagnosis not present

## 2018-09-26 DIAGNOSIS — R293 Abnormal posture: Secondary | ICD-10-CM | POA: Diagnosis not present

## 2018-09-26 DIAGNOSIS — M2560 Stiffness of unspecified joint, not elsewhere classified: Secondary | ICD-10-CM | POA: Diagnosis not present

## 2018-09-26 DIAGNOSIS — M546 Pain in thoracic spine: Secondary | ICD-10-CM | POA: Diagnosis not present

## 2018-09-29 DIAGNOSIS — M546 Pain in thoracic spine: Secondary | ICD-10-CM | POA: Diagnosis not present

## 2018-09-29 DIAGNOSIS — M2560 Stiffness of unspecified joint, not elsewhere classified: Secondary | ICD-10-CM | POA: Diagnosis not present

## 2018-09-29 DIAGNOSIS — R293 Abnormal posture: Secondary | ICD-10-CM | POA: Diagnosis not present

## 2018-10-01 DIAGNOSIS — M546 Pain in thoracic spine: Secondary | ICD-10-CM | POA: Diagnosis not present

## 2018-10-01 DIAGNOSIS — R293 Abnormal posture: Secondary | ICD-10-CM | POA: Diagnosis not present

## 2018-10-01 DIAGNOSIS — M2560 Stiffness of unspecified joint, not elsewhere classified: Secondary | ICD-10-CM | POA: Diagnosis not present

## 2018-10-03 DIAGNOSIS — R293 Abnormal posture: Secondary | ICD-10-CM | POA: Diagnosis not present

## 2018-10-03 DIAGNOSIS — M546 Pain in thoracic spine: Secondary | ICD-10-CM | POA: Diagnosis not present

## 2018-10-03 DIAGNOSIS — M2560 Stiffness of unspecified joint, not elsewhere classified: Secondary | ICD-10-CM | POA: Diagnosis not present

## 2018-10-06 DIAGNOSIS — R293 Abnormal posture: Secondary | ICD-10-CM | POA: Diagnosis not present

## 2018-10-06 DIAGNOSIS — M2560 Stiffness of unspecified joint, not elsewhere classified: Secondary | ICD-10-CM | POA: Diagnosis not present

## 2018-10-06 DIAGNOSIS — M546 Pain in thoracic spine: Secondary | ICD-10-CM | POA: Diagnosis not present

## 2018-12-17 ENCOUNTER — Other Ambulatory Visit: Payer: Self-pay

## 2018-12-17 ENCOUNTER — Ambulatory Visit (INDEPENDENT_AMBULATORY_CARE_PROVIDER_SITE_OTHER): Payer: 59 | Admitting: Physician Assistant

## 2018-12-17 ENCOUNTER — Ambulatory Visit (INDEPENDENT_AMBULATORY_CARE_PROVIDER_SITE_OTHER): Payer: 59

## 2018-12-17 ENCOUNTER — Encounter: Payer: Self-pay | Admitting: Physician Assistant

## 2018-12-17 VITALS — BP 110/74 | HR 97 | Temp 99.4°F | Ht 67.25 in | Wt 136.5 lb

## 2018-12-17 DIAGNOSIS — M545 Low back pain, unspecified: Secondary | ICD-10-CM

## 2018-12-17 DIAGNOSIS — M546 Pain in thoracic spine: Secondary | ICD-10-CM

## 2018-12-17 IMAGING — DX THORACIC SPINE - 3 VIEWS
3 series · 3 of 3 positions shown · non-contrast
Comparison: None.

CLINICAL DATA: Upper chest pain common no known injury, initial
encounter

EXAM:
THORACIC SPINE - 3 VIEWS

[thoracic spine ap]
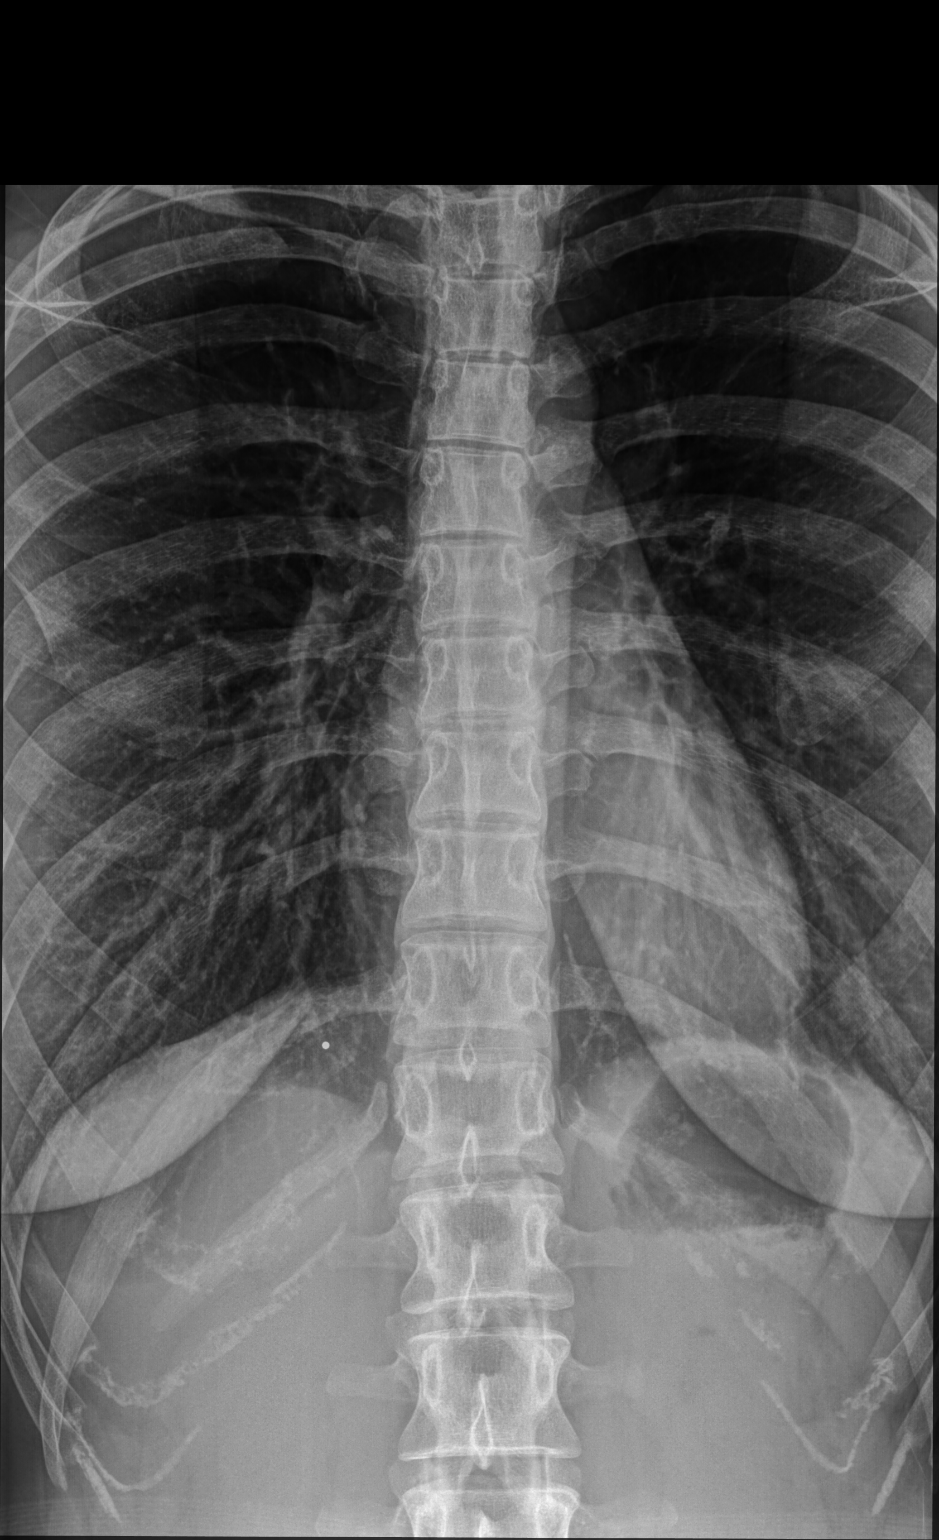

[thoracic spine lat (1 of 2)]
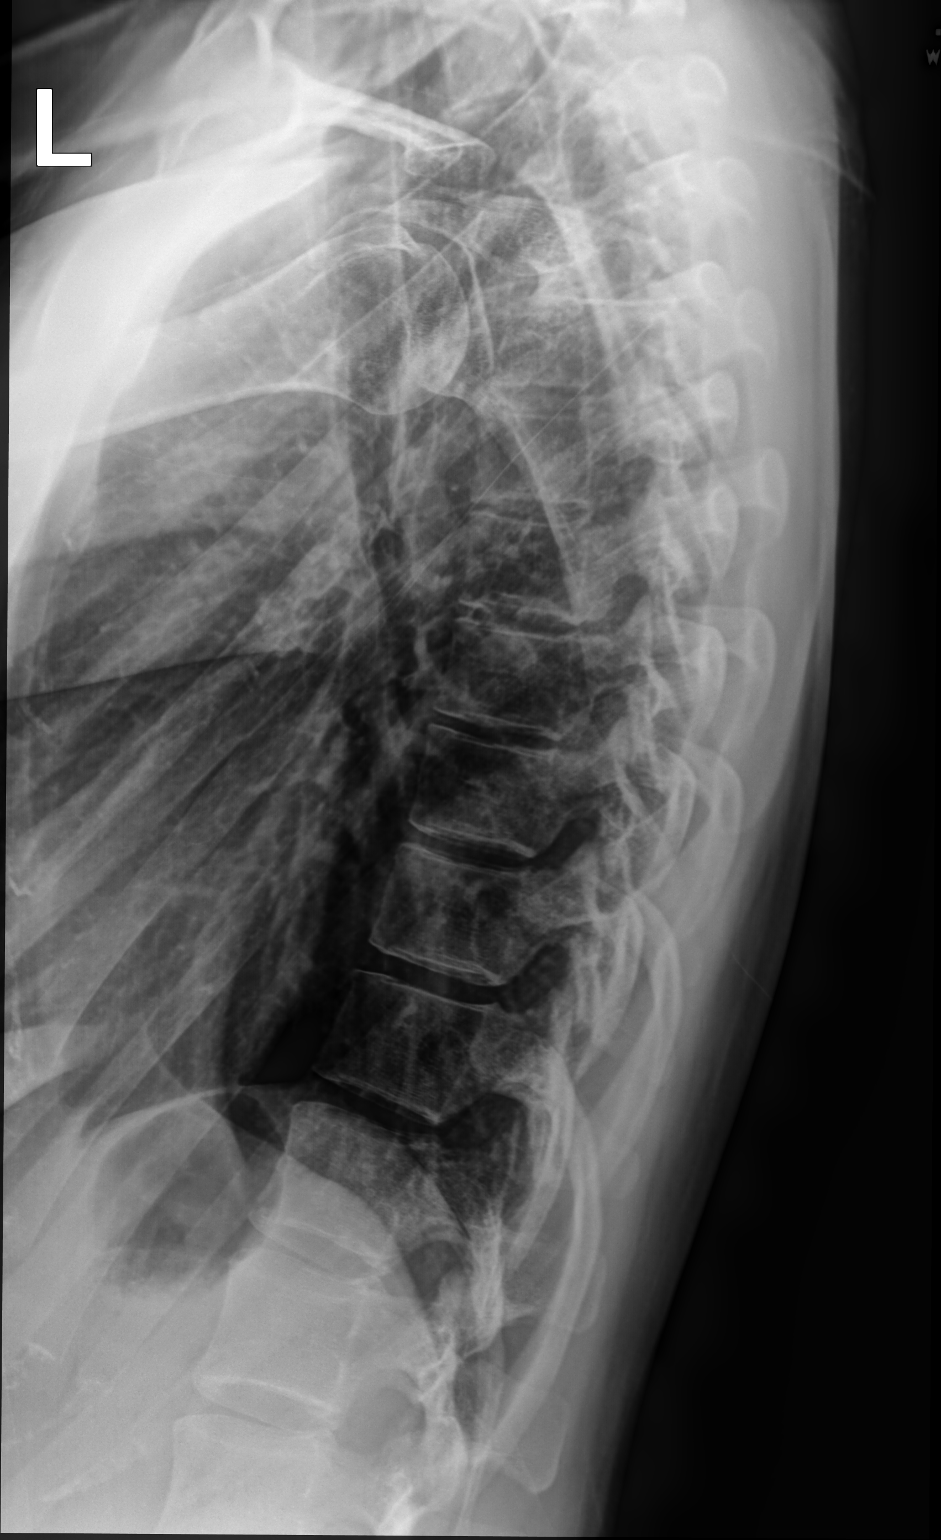

[thoracic spine lat (2 of 2)]
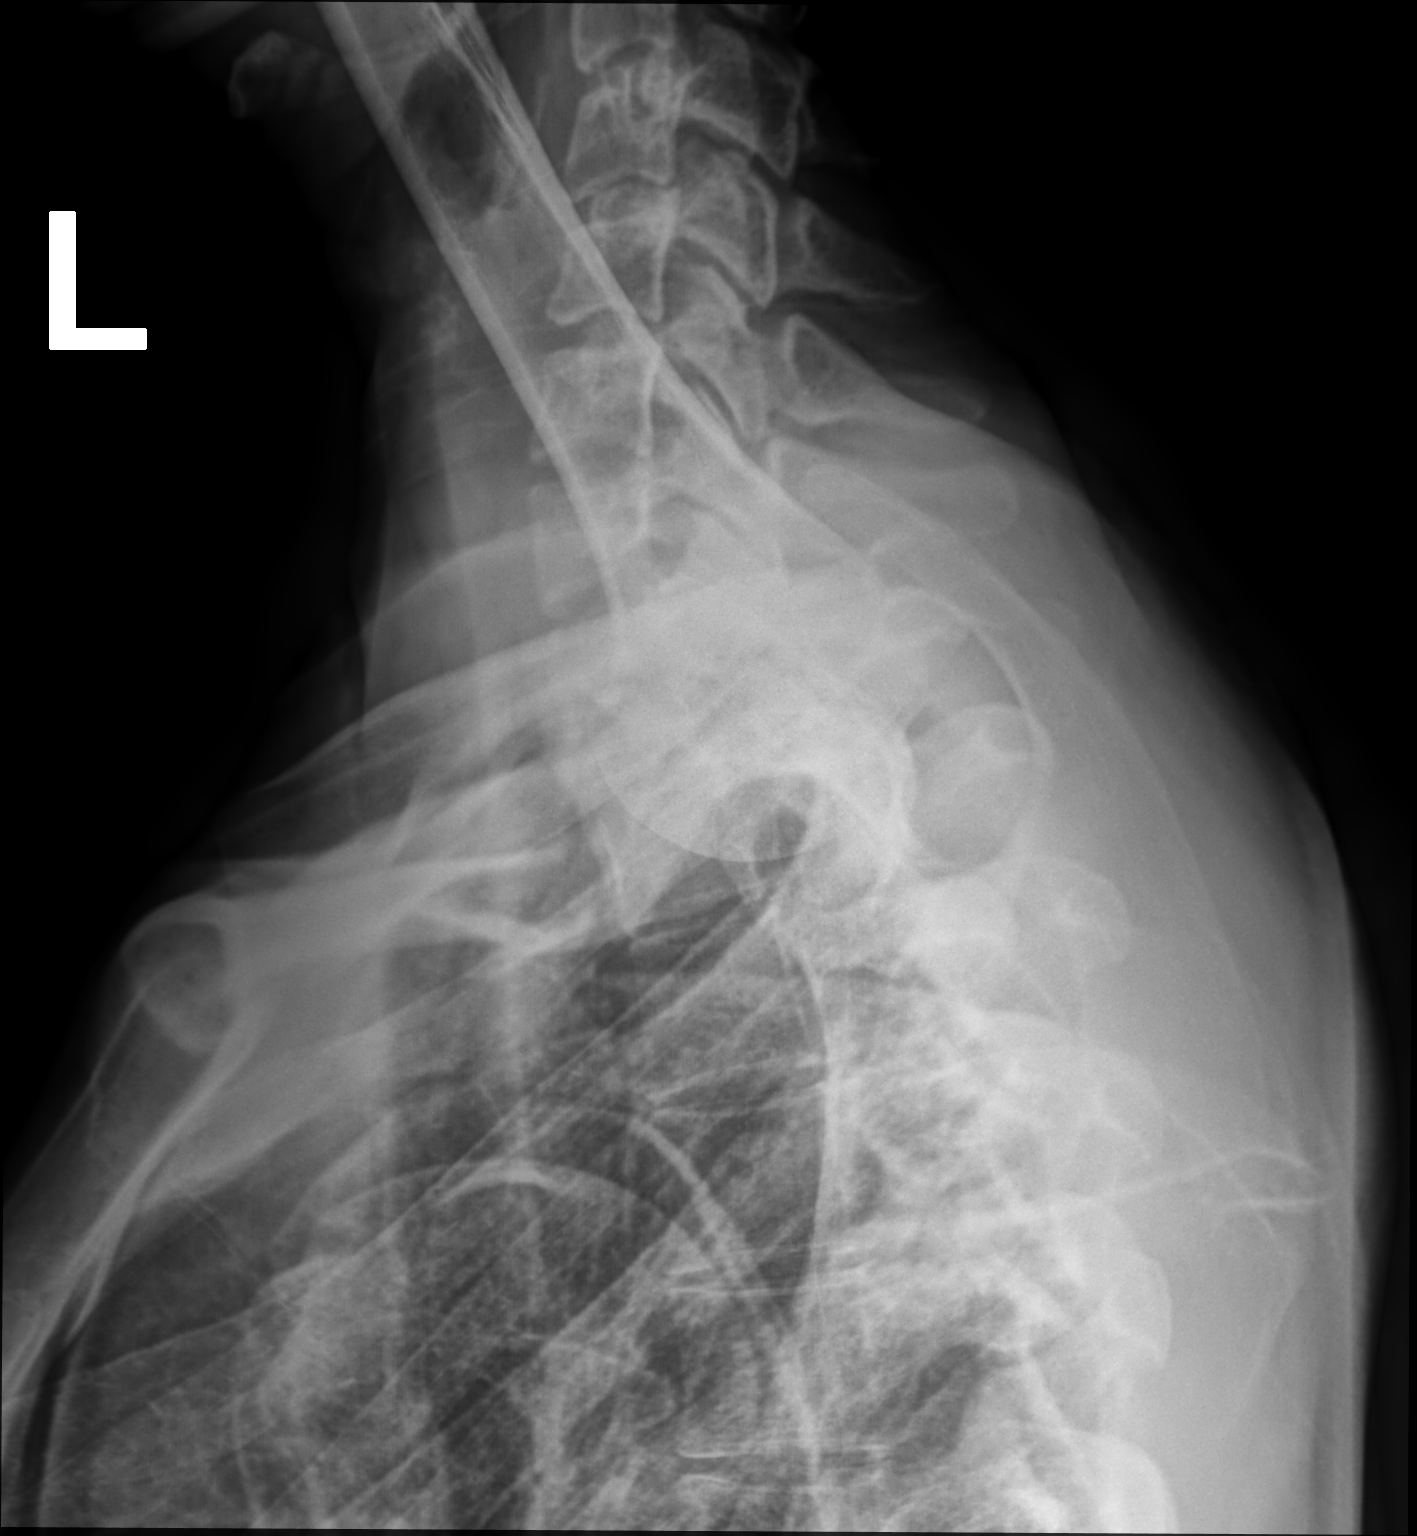

[3 of 3 positions shown; findings below may reference images not displayed]

FINDINGS: Vertebral body height is well maintained. Pedicles are within normal
limits and no paraspinal mass lesion is seen. No rib abnormality is
noted.
IMPRESSION: No acute abnormality noted.

## 2018-12-17 NOTE — Progress Notes (Signed)
Katie Oliver is a 23 y.o. female here for a new problem.  I acted as a Education administrator for [employer], PA-C Katie Pickler, LPN  History of Present Illness:   Chief Complaint  Patient presents with  . Mid back pain    HPI    Back pain Pt c/o mid back pain started 2 days ago and feels a knot there at times. No radiation. Has not taken any pain medication or tried anything for her symptoms. Does get massages regularly, last one was a month ago. Denies any trauma. Does have urinary frequency but has been seeing her ob-gyn for this and has had negative work-up thus far. Denies history of kidney stones or other kidney problems. Denies unintentional weight loss, fevers, loss of bowel/bladder function.    Past Medical History:  Diagnosis Date  . Allergy      Social History   Socioeconomic History  . Marital status: Single    Spouse name: Not on file  . Number of children: Not on file  . Years of education: Not on file  . Highest education level: Not on file  Occupational History  . Occupation: Ship broker   Social Needs  . Financial resource strain: Not on file  . Food insecurity    Worry: Not on file    Inability: Not on file  . Transportation needs    Medical: Not on file    Non-medical: Not on file  Tobacco Use  . Smoking status: Never Smoker  . Smokeless tobacco: Never Used  Substance and Sexual Activity  . Alcohol use: No    Frequency: Never  . Drug use: No  . Sexual activity: Yes  Lifestyle  . Physical activity    Days per week: Not on file    Minutes per session: Not on file  . Stress: Not on file  Relationships  . Social Herbalist on phone: Not on file    Gets together: Not on file    Attends religious service: Not on file    Active member of club or organization: Not on file    Attends meetings of clubs or organizations: Not on file    Relationship status: Not on file  . Intimate partner violence    Fear of current or ex partner: Not on file     Emotionally abused: Not on file    Physically abused: Not on file    Forced sexual activity: Not on file  Other Topics Concern  . Not on file  Social History Narrative  . Not on file    Past Surgical History:  Procedure Laterality Date  . WISDOM TOOTH EXTRACTION      Family History  Problem Relation Age of Onset  . Cancer Maternal Grandmother        Skin   . Cancer Maternal Grandfather        Prostate  . Heart attack Paternal Grandfather   . Diabetes Paternal Grandfather     No Known Allergies  Current Medications:   Current Outpatient Medications:  .  JUNEL FE 1/20 1-20 MG-MCG tablet, Take 1 tablet by mouth daily., Disp: , Rfl: 3 .  VENTOLIN HFA 108 (90 Base) MCG/ACT inhaler, Inhale 2 puffs into the lungs every 4 (four) hours as needed for wheezing or shortness of breath., Disp: 1 Inhaler, Rfl: 3   Review of Systems:   ROS  Negative unless otherwise specified per HPI.  Vitals:   Vitals:   12/17/18 1314  BP: 110/74  Pulse: 97  Temp: 99.4 F (37.4 C)  TempSrc: Oral  SpO2: 96%  Weight: 136 lb 8 oz (61.9 kg)  Height: 5' 7.25" (1.708 m)     Body mass index is 21.22 kg/m.  Physical Exam:   Physical Exam Vitals signs and nursing note reviewed.  Constitutional:      General: She is not in acute distress.    Appearance: She is well-developed. She is not ill-appearing or toxic-appearing.  Cardiovascular:     Rate and Rhythm: Normal rate and regular rhythm.     Pulses: Normal pulses.     Heart sounds: Normal heart sounds, S1 normal and S2 normal.     Comments: No LE edema Pulmonary:     Effort: Pulmonary effort is normal.     Breath sounds: Normal breath sounds.  Musculoskeletal:     Comments: No decreased ROM 2/2 pain with flexion/extension, lateral side bends, or rotation. Reproducible tenderness with deep palpation to R paraspinal thoracic muscle. No bony tenderness. No evidence of erythema, rash or ecchymosis.   Skin:    General: Skin is warm and dry.   Neurological:     Mental Status: She is alert.     GCS: GCS eye subscore is 4. GCS verbal subscore is 5. GCS motor subscore is 6.  Psychiatric:        Speech: Speech normal.        Behavior: Behavior normal. Behavior is cooperative.      Assessment and Plan:   Katie Oliver was seen today for mid back pain.  Diagnoses and all orders for this visit:  Acute left-sided thoracic back pain -     DG Thoracic Spine W/Swimmers; Future   Suspect spasm, however will obtain xray per patient request to r/o other etiology. She does admittedly have health anxiety and has already discussed that she will likely want u/s if xray is negative.  Briefly discussed vitamin D supplementation as she has concerns about vitamin deficiencies, deferred lab testing today.  . Reviewed expectations re: course of current medical issues. . Discussed self-management of symptoms. . Outlined signs and symptoms indicating need for more acute intervention. . Patient verbalized understanding and all questions were answered. . See orders for this visit as documented in the electronic medical record. . Patient received an After-Visit Summary.  CMA or LPN served as scribe during this visit. History, Physical, and Plan performed by medical provider. The above documentation has been reviewed and is accurate and complete.  Inda Coke, PA-C

## 2018-12-17 NOTE — Patient Instructions (Signed)
It was great to see you!  You can trial vitamin D and magnesium, as we discussed. We will be in touch with your xray results as soon as they return and then determine if further imaging is warranted.  If you ever need anything, we are here!  Take care,  Inda Coke PA-C

## 2018-12-18 ENCOUNTER — Telehealth: Payer: Self-pay

## 2018-12-18 DIAGNOSIS — M546 Pain in thoracic spine: Secondary | ICD-10-CM

## 2018-12-18 NOTE — Telephone Encounter (Signed)
Please see message and advise 

## 2018-12-18 NOTE — Telephone Encounter (Signed)
Copied from [place] [phone]. Topic: General - Other >> Dec 18, 2018  2:21 PM Keene Breath wrote: Reason for CRM: Patient called to as Dr. Morene Rankins if they could proceed with the next step  for an ultrasound, since the x-ray did not show anything.  Please advise and call patient to let her know.  CB# [phone]

## 2018-12-19 NOTE — Telephone Encounter (Signed)
Spoke to pt told her Katie Oliver said I think the best next step would send her to sports medicine, as they have ultrasound capabilities for further evaluation.Pt verbalized understanding. She has seen [name] in the past, but unfortunately he is no longer with our practice.  She would like to send you to [name] at [facility] if she is agreeable so he can guide [place] further. He is usually able to get patients in more quickly than our other sports medicine provider at this time, but if she prefers [facility], I can definitely send her over to [name].  Both are excellent options.Pt said [name] is fine. Told her okay will send referral and someone will contact you to schedule an appointment. Pt verbalized understanding.

## 2018-12-19 NOTE — Addendum Note (Signed)
Addended by: Marian Sorrow on: 12/19/2018 02:41 PM   Modules accepted: Orders

## 2018-12-19 NOTE — Telephone Encounter (Signed)
Please call patient.  I think the best next step would send her to sports medicine, as they have ultrasound capabilities for further evaluation.  She has seen [name] in the past, but unfortunately he is no longer with our practice.   I would like to send her to [name] at [facility] if she is agreeable so he can guide [place] further. He is usually able to get patients in more quickly than our other sports medicine provider at this time, but if she prefers [facility], I can definitely send her over to [name].  Both are excellent options.

## 2018-12-29 ENCOUNTER — Encounter: Payer: Self-pay | Admitting: Family Medicine

## 2018-12-29 ENCOUNTER — Other Ambulatory Visit: Payer: Self-pay

## 2018-12-29 ENCOUNTER — Ambulatory Visit (INDEPENDENT_AMBULATORY_CARE_PROVIDER_SITE_OTHER): Payer: 59 | Admitting: Family Medicine

## 2018-12-29 DIAGNOSIS — M546 Pain in thoracic spine: Secondary | ICD-10-CM | POA: Diagnosis not present

## 2018-12-29 DIAGNOSIS — G8929 Other chronic pain: Secondary | ICD-10-CM | POA: Diagnosis not present

## 2018-12-29 DIAGNOSIS — M545 Low back pain: Secondary | ICD-10-CM

## 2018-12-29 MED ORDER — TIZANIDINE HCL 2 MG PO TABS: 2.0000 mg | ORAL_TABLET | Freq: Four times a day (QID) | ORAL | 1 refills | Status: AC | PRN

## 2018-12-29 NOTE — Progress Notes (Signed)
Office Visit Note   Patient: Katie Oliver           Date of Birth: 24-Jun-1995           MRN: [id number] Visit Date: 12/29/2018 Requested by: Inda Coke, [place],  [place] [address] PCP: Vivi Barrack, MD  Subjective: Chief Complaint  Patient presents with  . Middle Back - Pain    Pain in middle part of her back, "just below the rib cage." NKI.     HPI: She is here with right mid-lower back pain.  She started noticing pain in this area a couple weeks ago while stretching.  She noticed a knot when palpating with her thumb.  It has bothered her intermittently since then, feels better today but still a little bit painful.  She had x-rays done recently which I reviewed and are unremarkable.  She would like to have ultrasound imaging done.  She also complains of chronic intermittent low back pain since she was a late teenager.  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  She never gets any sciatica symptoms.  She tried chiropractic once with some improvement.  She does not take medications for her pain.  Her dad had back surgery years ago, some sort of disc related surgery.  She struggles with chronic gut issues.  Wonders whether it could be related to her pain.  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                ROS: Denies fevers/chills.  All other systems were reviewed and are negative.  Objective: Vital Signs: LMP 12/08/2018   Physical Exam:  General:  Alert and oriented, in no acute distress. Pulm:  Breathing unlabored. Psy:  Normal mood, congruent affect. Skin:  No rash  Back: She has equal leg lengths, no scoliosis.  Very tight bilateral hamstrings and heel cords.  She has a tender trigger point to the right of midline in the lower thoracic/upper lumbar area, paraspinous muscles.  This seems to reproduce her pain.  Her chronic low back  pain is near the midline L5-S1 region but is not tender to palpation today.  Straight leg raise negative, lower extremity strength and reflexes are normal.  Imaging: Limited diagnostic ultrasound: I imaged the region of the myofascial trigger point and did not see any muscular abnormalities when compared to the asymptomatic left side.   Assessment & Plan: 1.  Probable myofascial right-sided low thoracic/upper lumbar pain. -Referral to physical therapy  2.  Chronic low back pain probably related to inflexibility and relative core weakness -Physical therapy to help her regain flexibility and core strength.  3..  Chronic GI dysfunction - Trial of food elimination diet.     Procedures: No procedures performed  No notes on file     PMFS History: Patient Active Problem List   Diagnosis Date Noted  . Polyarthralgia 03/14/2018  . Chronic rhinitis 03/14/2018  . Abdominal pain 03/14/2018  . Exercise-induced asthma 04/17/2017   Past Medical History:  Diagnosis Date  . Allergy     Family History  Problem Relation Age of Onset  . Cancer Maternal Grandmother        Skin   . Cancer Maternal Grandfather        Prostate  . Heart attack Paternal Grandfather   . Diabetes Paternal Grandfather  Past Surgical History:  Procedure Laterality Date  . WISDOM TOOTH EXTRACTION     Social History   Occupational History  . Occupation: Ship broker   Tobacco Use  . Smoking status: Never Smoker  . Smokeless tobacco: Never Used  Substance and Sexual Activity  . Alcohol use: No    Frequency: Never  . Drug use: No  . Sexual activity: Yes

## 2020-01-28 ENCOUNTER — Other Ambulatory Visit: Payer: Self-pay | Admitting: Physician Assistant

## 2020-01-28 DIAGNOSIS — R202 Paresthesia of skin: Secondary | ICD-10-CM

## 2020-01-28 DIAGNOSIS — R29898 Other symptoms and signs involving the musculoskeletal system: Secondary | ICD-10-CM

## 2020-01-28 DIAGNOSIS — M25512 Pain in left shoulder: Secondary | ICD-10-CM

## 2020-02-19 ENCOUNTER — Ambulatory Visit
Admission: RE | Admit: 2020-02-19 | Discharge: 2020-02-19 | Disposition: A | Discharge status: Home / Self Care | Payer: 59 | Source: Ambulatory Visit | Attending: Physician Assistant | Admitting: Physician Assistant

## 2020-02-19 DIAGNOSIS — M25512 Pain in left shoulder: Secondary | ICD-10-CM

## 2020-02-19 DIAGNOSIS — R202 Paresthesia of skin: Secondary | ICD-10-CM

## 2020-02-19 DIAGNOSIS — R29898 Other symptoms and signs involving the musculoskeletal system: Secondary | ICD-10-CM

## 2020-02-19 IMAGING — MR MR SHOULDER*L* W/O CM
5 series · 40 of 40 positions shown · non-contrast
Comparison: None.

CLINICAL DATA: Left shoulder pain.  Numbness in the hands.

EXAM:
MRI OF THE LEFT SHOULDER WITHOUT CONTRAST
TECHNIQUE: Multiplanar, multisequence MR imaging of the shoulder was performed.
No intravenous contrast was administered.

[Series 3: PD fat-sat · axial · 4.0mm · 0.27mm/px · z∈[-87,+13]mm · 10 of 23 slices shown (1 of 2)]
[im 1/23]
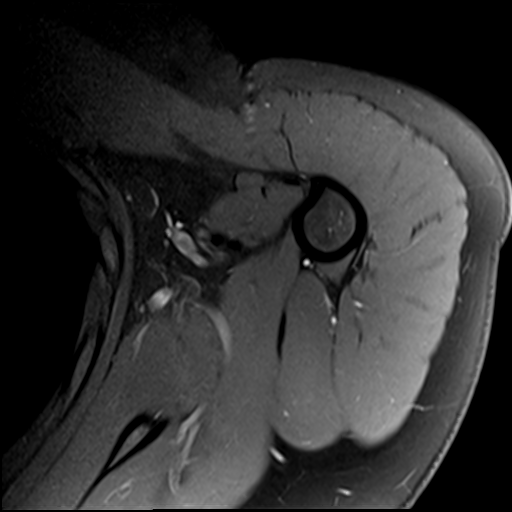
[im 3/23]
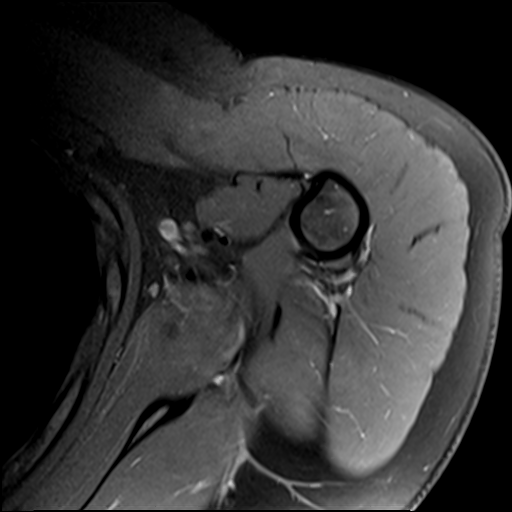
[im 5/23]
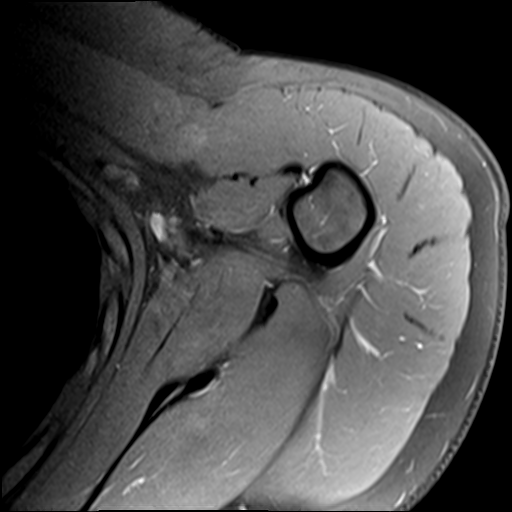
[im 8/23]
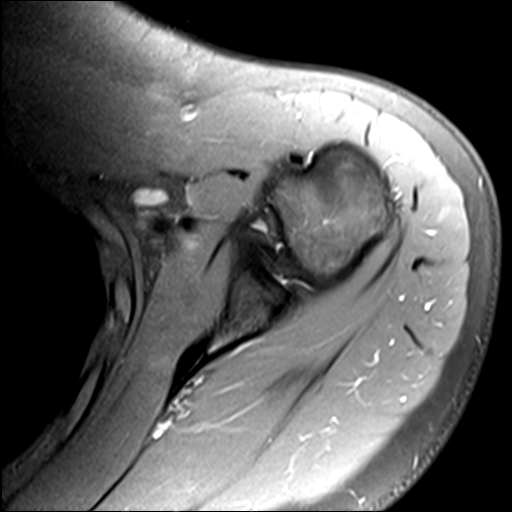
[im 10/23]
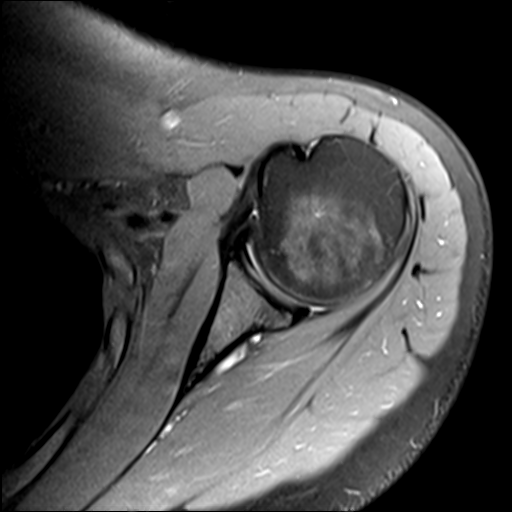
[im 13/23]
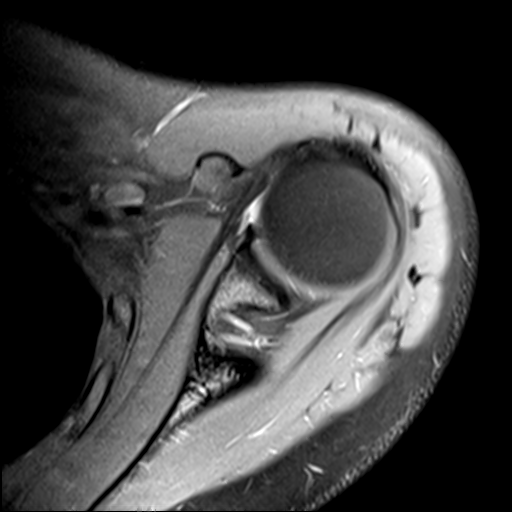
[im 15/23]
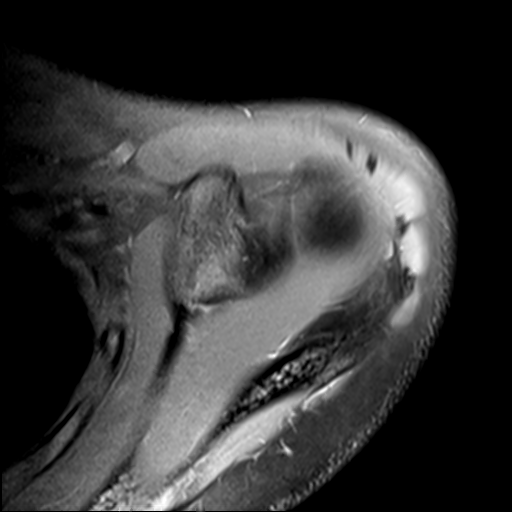
[im 18/23]
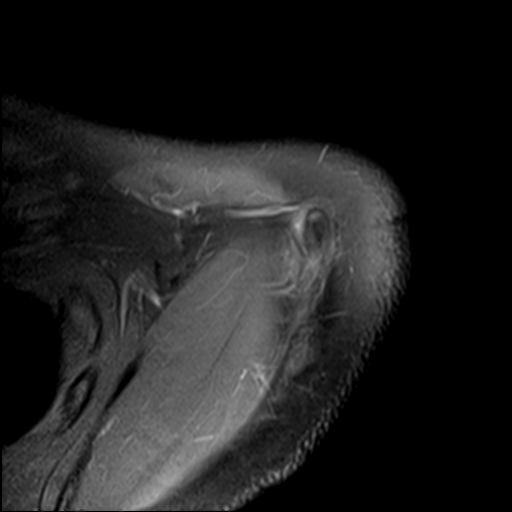
[im 20/23]
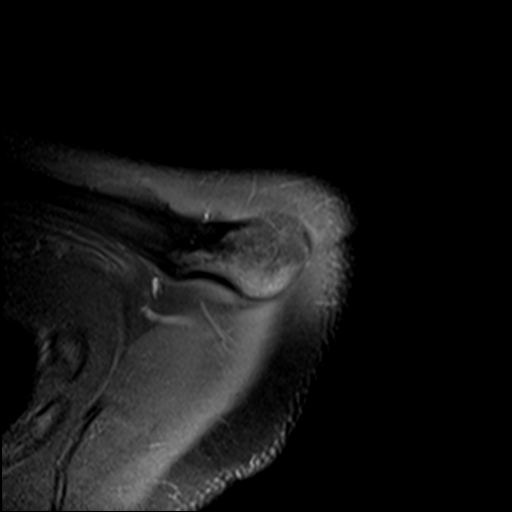
[im 23/23]
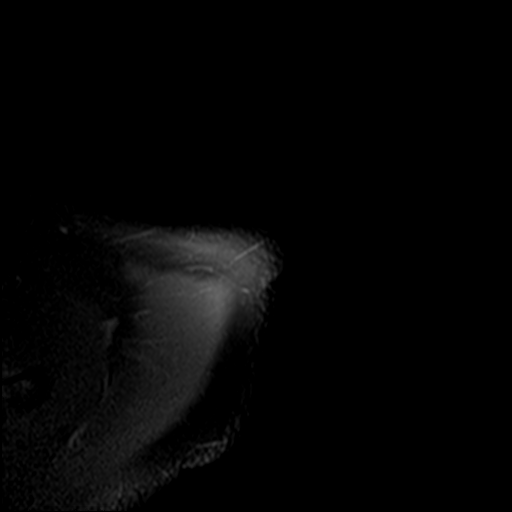

[Series 4: T2 fat-sat · oblique · 4.0mm · 0.59mm/px · 7 of 18 slices shown (1 of 2)]
[im 1/18]
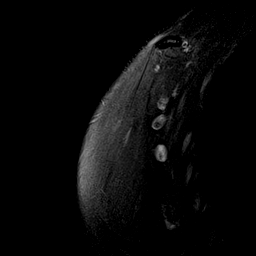
[im 3/18]
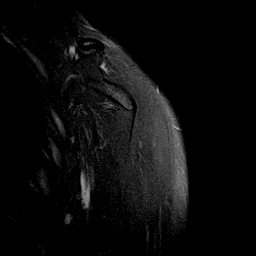
[im 6/18]
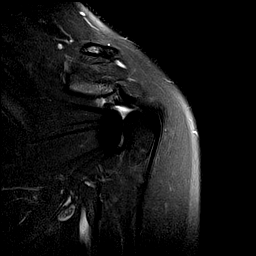
[im 9/18]
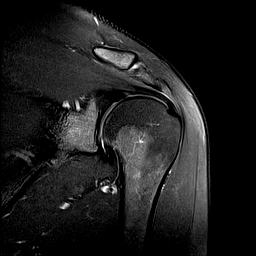
[im 12/18]
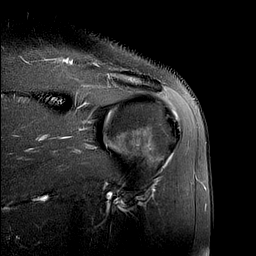
[im 15/18]
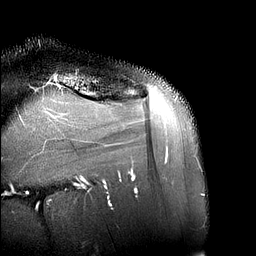
[im 18/18]
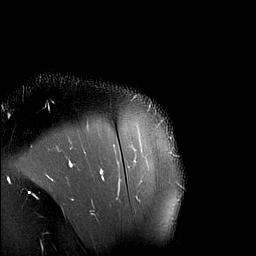

[Series 5: PD fat-sat · oblique · 4.0mm · 0.59mm/px · 7 of 18 slices shown (2 of 2)]
[im 1/18]
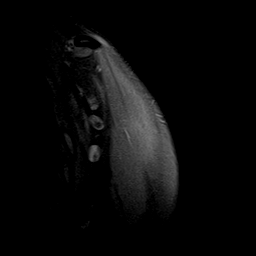
[im 3/18]
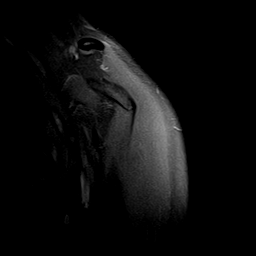
[im 6/18]
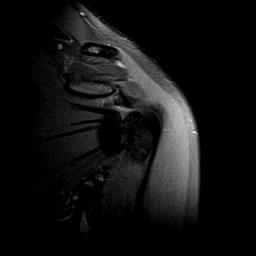
[im 9/18]
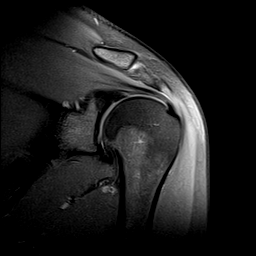
[im 12/18]
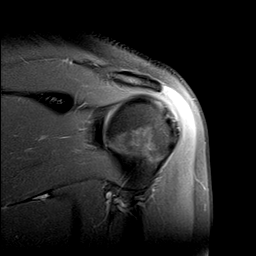
[im 15/18]
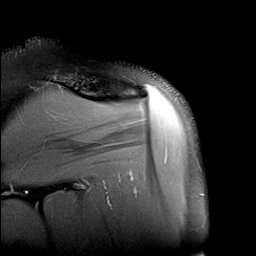
[im 18/18]
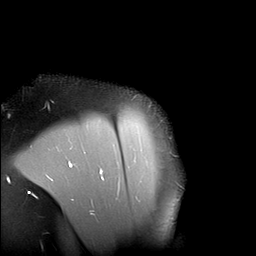

[Series 6: T2 fat-sat · oblique · 4.0mm · 0.59mm/px · 8 of 20 slices shown (2 of 2)]
[im 1/20]
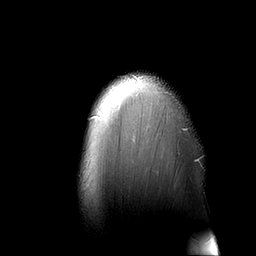
[im 3/20]
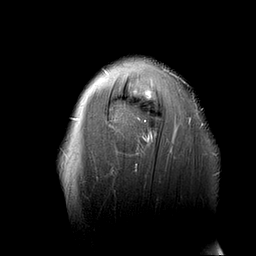
[im 6/20]
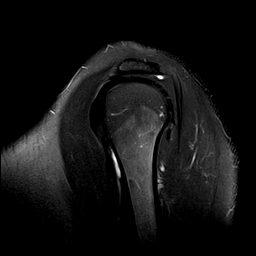
[im 9/20]
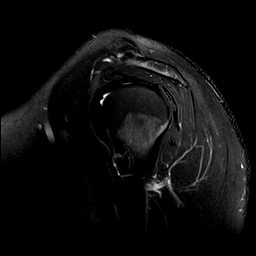
[im 11/20]
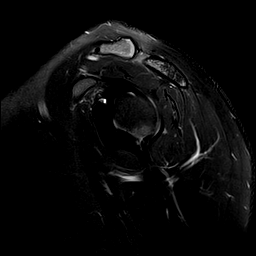
[im 14/20]
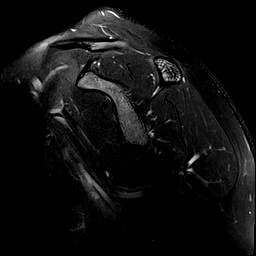
[im 17/20]
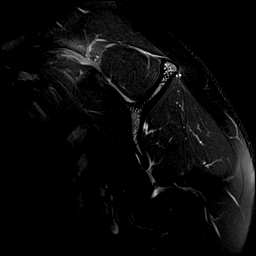
[im 20/20]
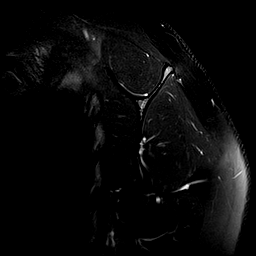

[Series 7: T1 · oblique · 4.0mm · 0.59mm/px · 8 of 20 slices shown]
[im 1/20]
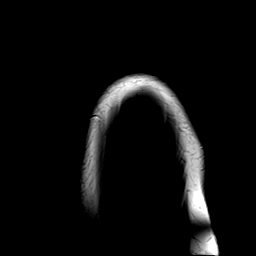
[im 3/20]
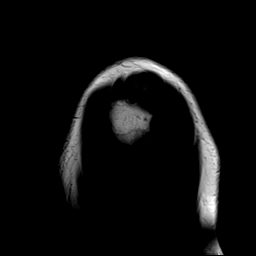
[im 6/20]
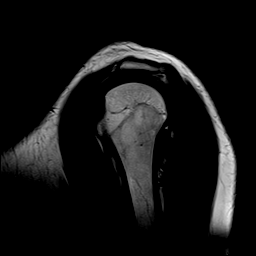
[im 9/20]
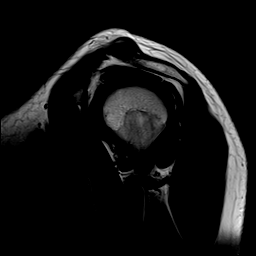
[im 11/20]
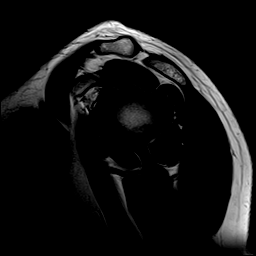
[im 14/20]
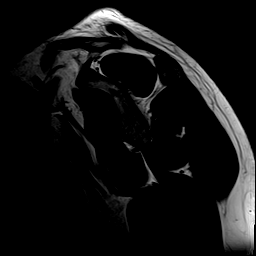
[im 17/20]
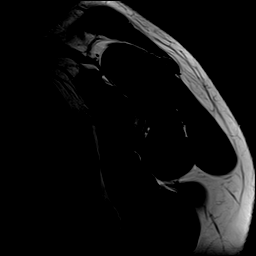
[im 20/20]
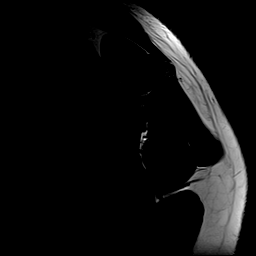

[40 of 40 positions shown; findings below may reference images not displayed]

FINDINGS: Rotator cuff: Supraspinatus tendon is intact. Mild tendinosis of the
infraspinatus tendon with a small insertional interstitial tear.
Teres minor tendon is intact. Subscapularis tendon is intact.

Muscles: No muscle atrophy or edema. No intramuscular fluid
collection or hematoma.

Biceps Long Head: Intraarticular and extraarticular portions of the
biceps tendon are intact.

Acromioclavicular Joint: Moderate arthropathy of the
acromioclavicular joint. Type II acromion. No subacromial/subdeltoid
bursal fluid.

Glenohumeral Joint: No joint effusion. No chondral defect.

Labrum: Grossly intact, but evaluation is limited by lack of
intraarticular fluid/contrast.

Bones: No fracture or dislocation. No aggressive osseous lesion.

Other: No fluid collection or hematoma.
IMPRESSION: 1. Mild tendinosis of the infraspinatus tendon with a small
insertional interstitial tear.

## 2020-02-19 IMAGING — MR MR CERVICAL SPINE W/O CM
5 series · 37 of 48 positions shown · non-contrast
Comparison: None.

CLINICAL DATA: Numbness in the hands. Bilateral shoulder pain, left
greater than right.

EXAM:
MRI CERVICAL SPINE WITHOUT CONTRAST
TECHNIQUE: Multiplanar, multisequence MR imaging of the cervical spine was
performed. No intravenous contrast was administered.

[Series 2: T2 · sagittal · 3.0mm · 0.41mm/px · 8 of 17 slices shown (1 of 2)]
[im 1/17]
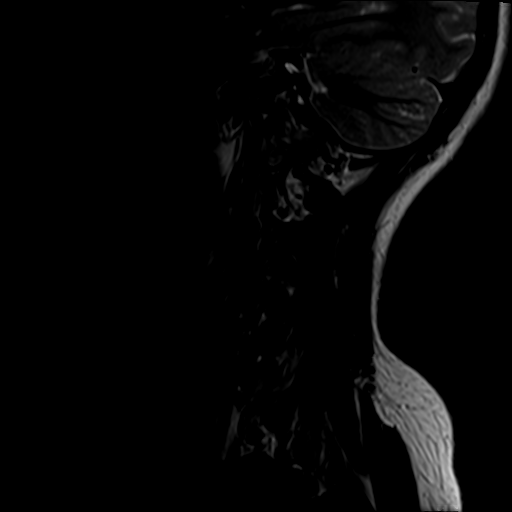
[im 3/17]
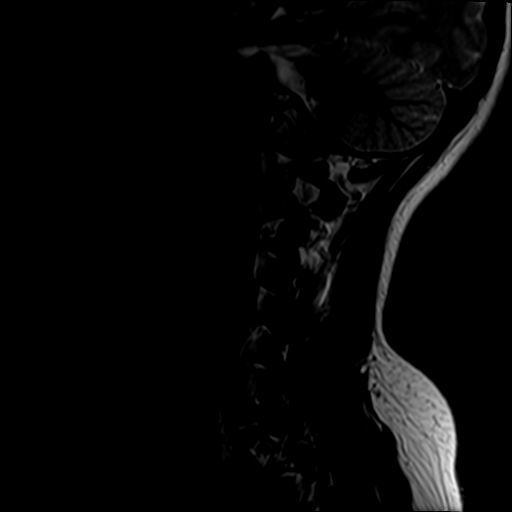
[im 5/17]
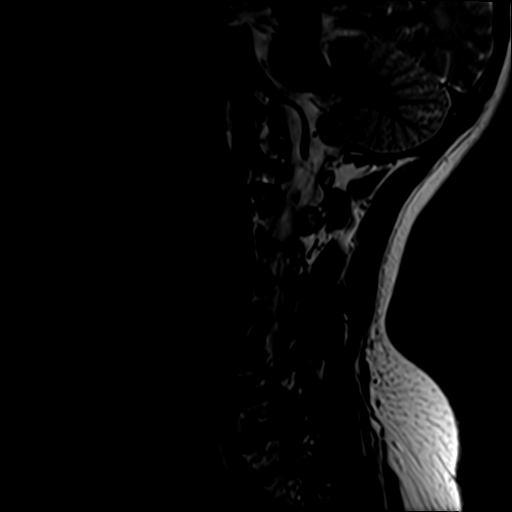
[im 7/17]
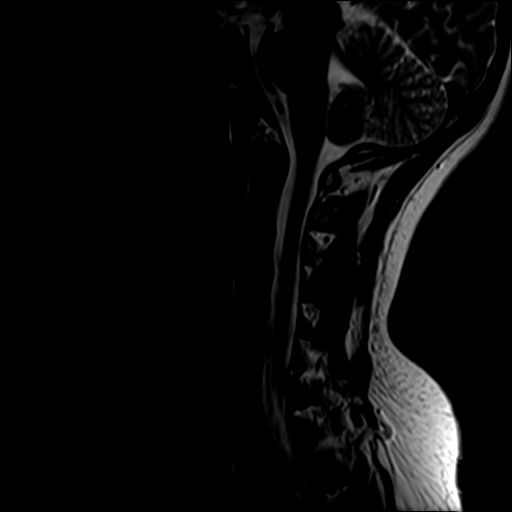
[im 10/17]
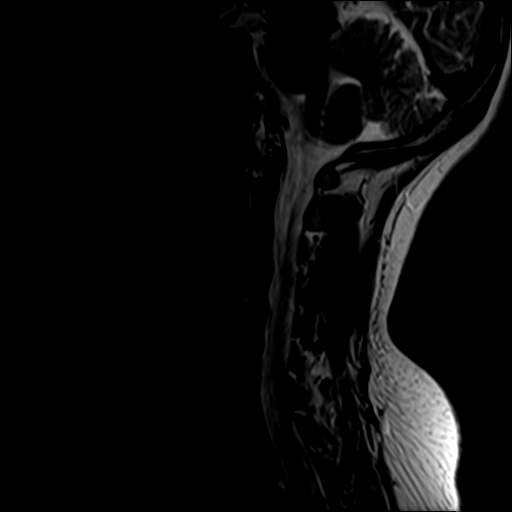
[im 12/17]
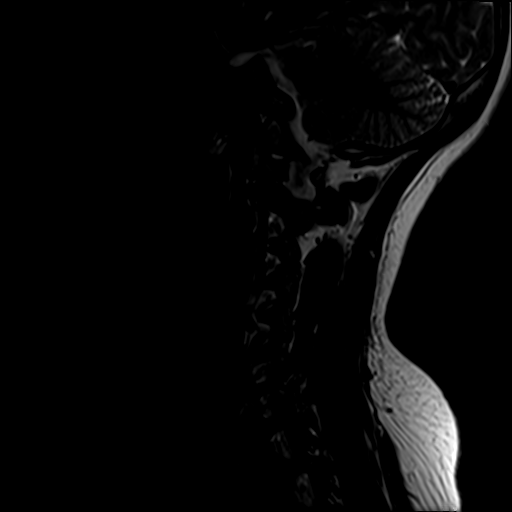
[im 14/17]
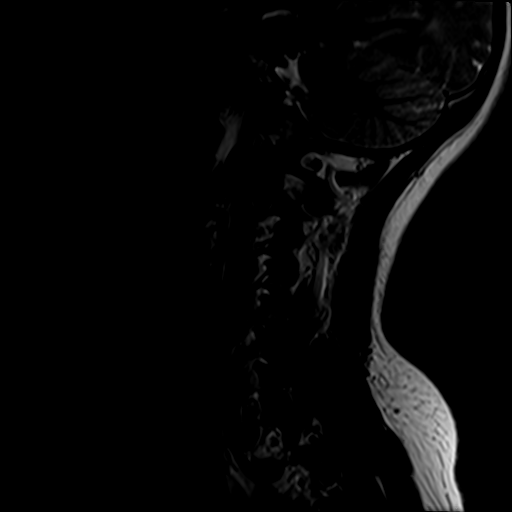
[im 17/17]
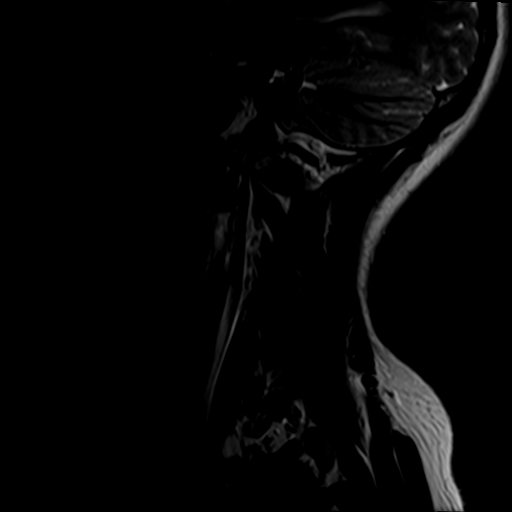

[Series 3: STIR · sagittal · 3.0mm · 0.82mm/px · 8 of 17 slices shown]
[im 1/17]
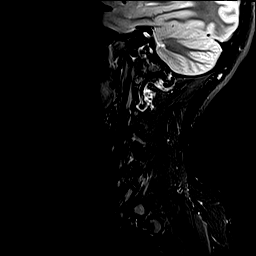
[im 3/17]
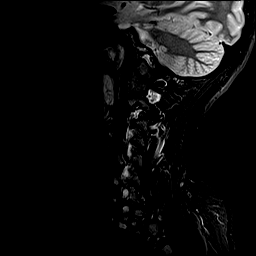
[im 5/17]
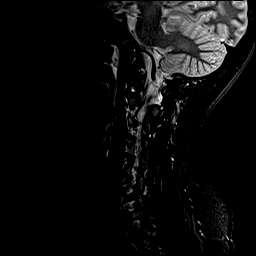
[im 7/17]
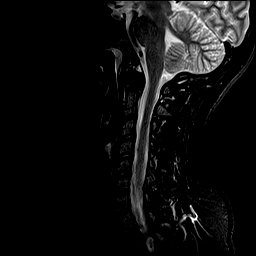
[im 10/17]
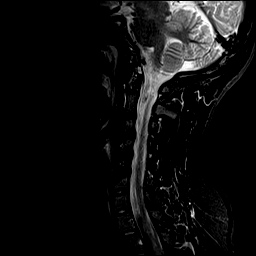
[im 12/17]
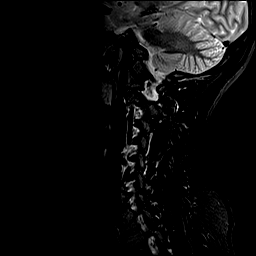
[im 14/17]
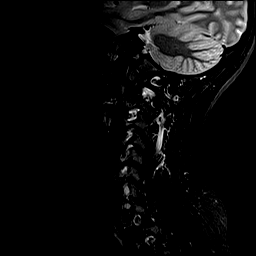
[im 17/17]
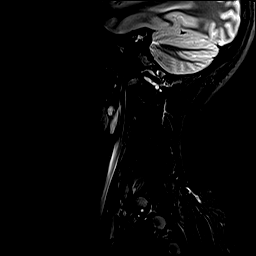

[Series 4: T1 · sagittal · 3.0mm · 0.82mm/px · 8 of 17 slices shown]
[im 1/17]
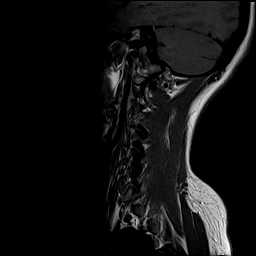
[im 3/17]
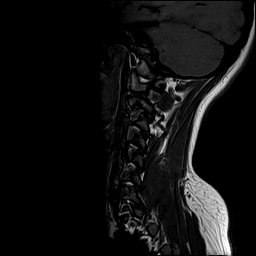
[im 5/17]
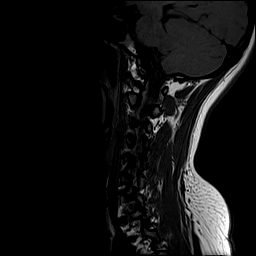
[im 7/17]
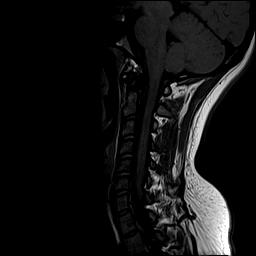
[im 10/17]
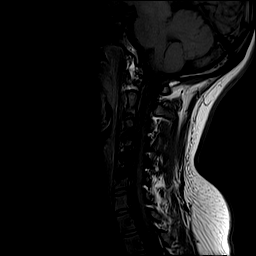
[im 12/17]
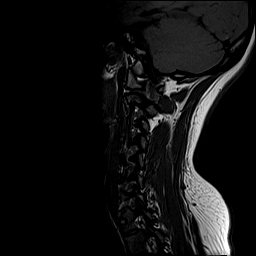
[im 14/17]
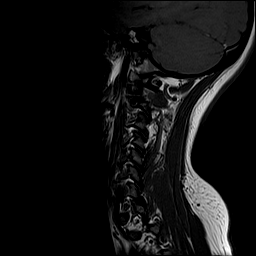
[im 17/17]
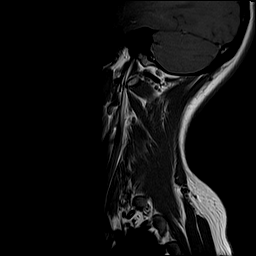

[Series 5: T2 · axial · 3.0mm · 0.70mm/px · z∈[-35,+53]mm · 9 of 25 slices shown (2 of 2)]
[im 1/25]
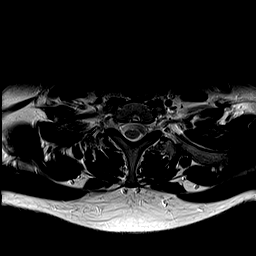
[im 5/25]
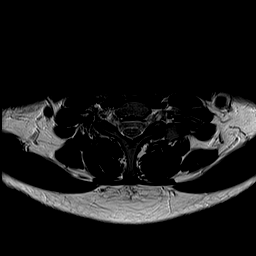
[im 7/25]
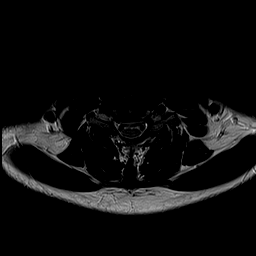
[im 11/25]
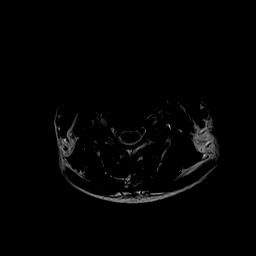
[im 14/25]
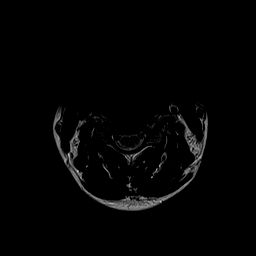
[im 18/25]
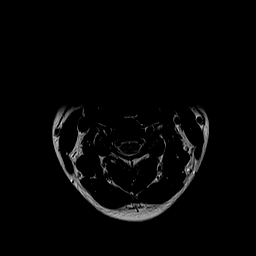
[im 20/25]
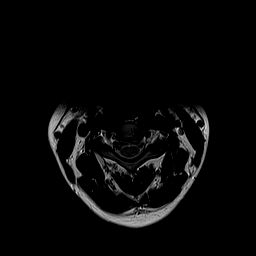
[im 22/25]
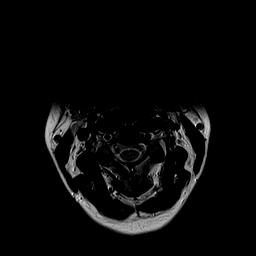
[im 25/25]
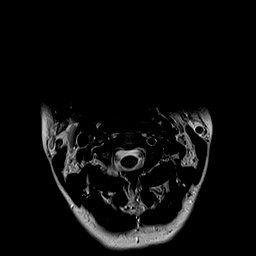

[Series 6: GRE · axial · 3.0mm · 0.35mm/px · z∈[-35,+2]mm · 4 of 25 slices shown]
[im 1/25]
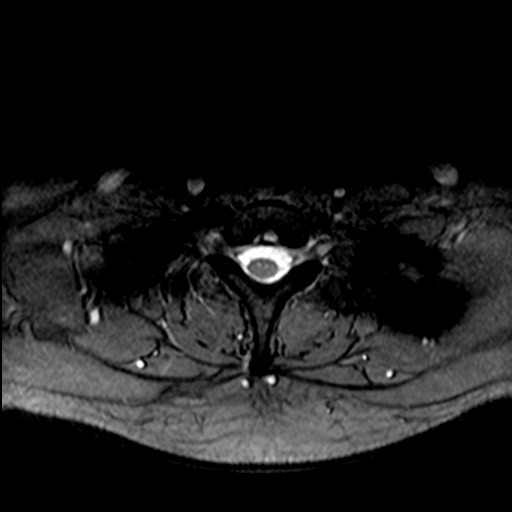
[im 5/25]
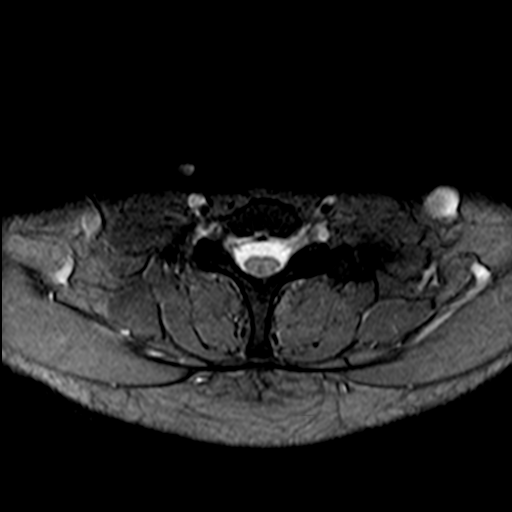
[im 7/25]
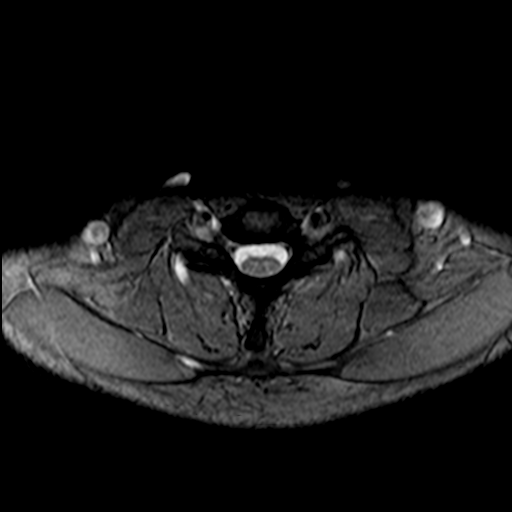
[im 11/25]
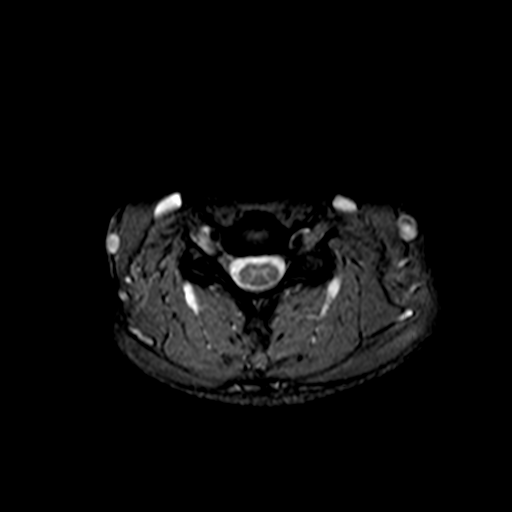

[37 of 48 positions shown; findings below may reference images not displayed]

FINDINGS: Alignment: No significant listhesis.

Vertebrae: No fracture, evidence of discitis, or bone lesion.

Cord: Normal signal and morphology.

Posterior Fossa, vertebral arteries, paraspinal tissues:
Craniocervical junction is normal. Flow is present in the vertebral
arteries bilaterally. Visualized intracranial contents are normal.

Disc levels:

C2-3: Negative.

C3-4: Negative.

C4-5: Negative.

C5-6: Negative.

C6-7: Negative.

C7-T1: Negative.
IMPRESSION: Negative MRI of the cervical spine. No acute or focal lesion to
explain the patient's symptoms.

## 2020-07-12 ENCOUNTER — Encounter: Payer: Self-pay | Admitting: Neurology

## 2020-09-12 ENCOUNTER — Encounter: Payer: Self-pay | Admitting: Neurology

## 2020-09-12 ENCOUNTER — Other Ambulatory Visit: Payer: Self-pay

## 2020-09-12 ENCOUNTER — Ambulatory Visit (INDEPENDENT_AMBULATORY_CARE_PROVIDER_SITE_OTHER): Payer: 59 | Admitting: Neurology

## 2020-09-12 VITALS — BP 98/60 | HR 85 | Ht 68.0 in | Wt 162.0 lb

## 2020-09-12 DIAGNOSIS — R413 Other amnesia: Secondary | ICD-10-CM | POA: Diagnosis not present

## 2020-09-12 DIAGNOSIS — R202 Paresthesia of skin: Secondary | ICD-10-CM

## 2020-09-12 DIAGNOSIS — R531 Weakness: Secondary | ICD-10-CM

## 2020-09-12 NOTE — Progress Notes (Signed)
[facility] Clinic Note - Initial Visit   Date: 09/12/20  SURIYAH VERGARA MRN: [id number] DOB: March 11, 1996   Dear Thayer Ohm, PA:  Thank you for your kind referral of CHESLEY VALLS for consultation of numbness/tingling. Although her history is well known to you, please allow [place] to reiterate it for the purpose of our medical record. The patient was accompanied to the clinic by mother who also provides collateral information.     History of Present Illness: BRAILEY BUESCHER is a 25 y.o. right-handed female with anxiety and fibromylagia presenting for evaluation of tingling.  Starting earlier this year, she began experiencing numbness over the left arm when she raises her arm 90 degrees.  She also has generalized numbness/tingling involving the entire body (face, arms, legs, chest), which occurs daily lasting a few seconds. Several times, she has tingling in the left leg which then radiates up to her cheek.  She also complains of blurred vision, fatigue, generalized weakness, and brain fog.  She has had similar symptoms before which was triggered by anxiety.    She lives with host family.  An older couple and three other single women.  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  She works part-time currently with plans to start fulltime work in May.   Out-side paper records, electronic medical record, and images have been reviewed where available and summarized as:  Lab Results  Component Value Date   TSH 1.59 03/14/2018   Lab Results  Component Value Date   ESRSEDRATE 1 03/14/2018    Past Medical History:  Diagnosis Date  . Allergy     Past Surgical History:  Procedure Laterality Date  . WISDOM TOOTH EXTRACTION       Medications:  Outpatient Encounter Medications as of 09/12/2020  Medication Sig  . venlafaxine XR (EFFEXOR-XR) 37.5 MG 24 hr capsule Take 37.5 mg by mouth daily.  . VENTOLIN HFA 108 (90 Base) MCG/ACT inhaler Inhale 2 puffs into the  lungs every 4 (four) hours as needed for wheezing or shortness of breath.  Lenda Kelp FE 1/20 1-20 MG-MCG tablet Take 1 tablet by mouth daily. (Patient not taking: Reported on 09/12/2020)  . tiZANidine (ZANAFLEX) 2 MG tablet Take 1-2 tablets (2-4 mg total) by mouth every 6 (six) hours as needed for muscle spasms. (Patient not taking: Reported on 09/12/2020)   No facility-administered encounter medications on file as of 09/12/2020.    Allergies: No Known Allergies  Family History: Family History  Problem Relation Age of Onset  . Cancer Mother        Skin Cancer  . High Cholesterol Father   . Cancer Maternal Grandmother        Skin   . Cancer Maternal Grandfather        Prostate  . Heart attack Paternal Grandfather   . Diabetes Paternal Grandfather     Social History: Social History   Tobacco Use  . Smoking status: Never Smoker  . Smokeless tobacco: Never Used  Vaping Use  . Vaping Use: Never used  Substance Use Topics  . Alcohol use: No  . Drug use: No   Social History   Social History Narrative   Right Handed   Lives in a two story home    Drinks Caffeine in the morning. Drinks a lot of green tea    Vital Signs:  BP 98/60   Pulse 85   Ht 5' 8"  (1.727 m)   Wt 162 lb (  73.5 kg)   SpO2 99%   BMI 24.63 kg/m    Neurological Exam: MENTAL STATUS including orientation to time, place, person, recent and remote memory, attention span and concentration, language, and fund of knowledge is normal.  Speech is not dysarthric.  CRANIAL NERVES: II:  No visual field defects.  III-IV-VI: Pupils equal round and reactive to light.  Normal conjugate, extra-ocular eye movements in all directions of gaze.  No nystagmus.  No ptosis.   V:  Normal facial sensation.    VII:  Normal facial symmetry and movements.   VIII:  Normal hearing and vestibular function.   IX-X:  Normal palatal movement.   XI:  Normal shoulder shrug and head rotation.   XII:  Normal tongue strength and range of  motion, no deviation or fasciculation.  MOTOR:  No atrophy, fasciculations or abnormal movements.  No pronator drift.   Upper Extremity:  Right  Left  Deltoid  5/5   5/5   Biceps  5/5   5/5   Triceps  5/5   5/5   Infraspinatus 5/5  5/5  Medial pectoralis 5/5  5/5  Wrist extensors  5/5   5/5   Wrist flexors  5/5   5/5   Finger extensors  5/5   5/5   Finger flexors  5/5   5/5   Dorsal interossei  5/5   5/5   Abductor pollicis  5/5   5/5   Tone (Ashworth scale)  0  0   Lower Extremity:  Right  Left  Hip flexors  5/5   5/5   Hip extensors  5/5   5/5   Adductor 5/5  5/5  Abductor 5/5  5/5  Knee flexors  5/5   5/5   Knee extensors  5/5   5/5   Dorsiflexors  5/5   5/5   Plantarflexors  5/5   5/5   Toe extensors  5/5   5/5   Toe flexors  5/5   5/5   Tone (Ashworth scale)  0  0   MSRs:  Right        Left                  brachioradialis 2+  2+  biceps 2+  2+  triceps 2+  2+  patellar 2+  2+  ankle jerk 2+  2+  Hoffman no  no  plantar response down  down   SENSORY:  Normal and symmetric perception of light touch, pinprick, vibration, and proprioception.  Romberg's sign absent.   COORDINATION/GAIT: Normal finger-to- nose-finger and heel-to-shin.  Intact rapid alternating movements bilaterally.  Able to rise from a chair without using arms.  Gait narrow based and stable. Tandem and stressed gait intact.    IMPRESSION: Myriad of symptoms including migratory paresthesias, blurred vision, cognitive changes, and fatigue.  Her neurological exam is entirely normal and non-lateralizing. Patient reassured that the likelihood of any worrisome neurological condition causing her symptoms is very low.  She has a long history of anxiety and I suspect symptoms are related to this.  To be complete, she will have MRI brain wwo contrast to evaluate for demyelinating disease, but my overall suspicion is very low.   Thank you for allowing me to participate in patient's care.  If I can answer any  additional questions, I would be pleased to do so.    Sincerely,    Zera Markwardt K. Posey Pronto, DO

## 2020-09-12 NOTE — Patient Instructions (Addendum)
MRI brain will be ordered.  We will notify you of the results.

## 2020-09-23 ENCOUNTER — Ambulatory Visit
Admission: RE | Admit: 2020-09-23 | Discharge: 2020-09-23 | Disposition: A | Discharge status: Home / Self Care | Payer: 59 | Source: Ambulatory Visit | Attending: Neurology | Admitting: Neurology

## 2020-09-23 DIAGNOSIS — R531 Weakness: Secondary | ICD-10-CM

## 2020-09-23 DIAGNOSIS — R413 Other amnesia: Secondary | ICD-10-CM

## 2020-09-23 DIAGNOSIS — R202 Paresthesia of skin: Secondary | ICD-10-CM

## 2020-09-23 IMAGING — MR MR HEAD WO/W CM
14 series · 48 of 48 positions shown · IV contrast (multihance)
Comparison: Cervical spine MRI [DATE].

CLINICAL DATA: Provided history: Facial paresthesia. Generalized
weakness. Memory change. Constellation of symptoms to rule out
multiple sclerosis. Additional history provided by scanning
technologist: Patient reports vertigo and tingling in hands, memory
loss for 2 months.

EXAM:
MRI HEAD WITHOUT AND WITH CONTRAST
TECHNIQUE: Multiplanar, multiecho pulse sequences of the brain and surrounding
structures were obtained without and with intravenous contrast.
CONTRAST:  15mL MULTIHANCE GADOBENATE DIMEGLUMINE 529 MG/ML IV SOLN

[Series 2: T1 · sagittal · 5.0mm · 0.45mm/px · 2 of 25 slices shown]
[im 1/25]
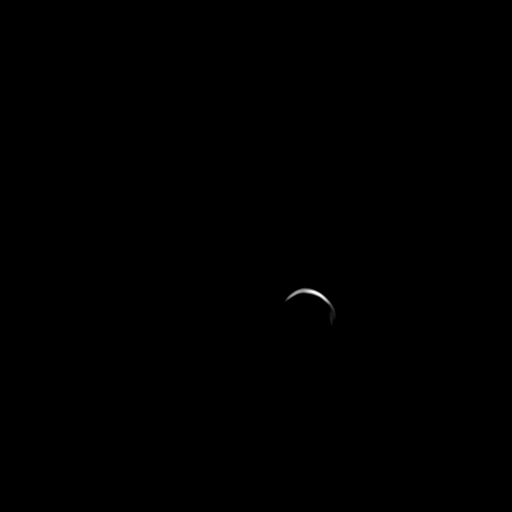
[im 25/25]
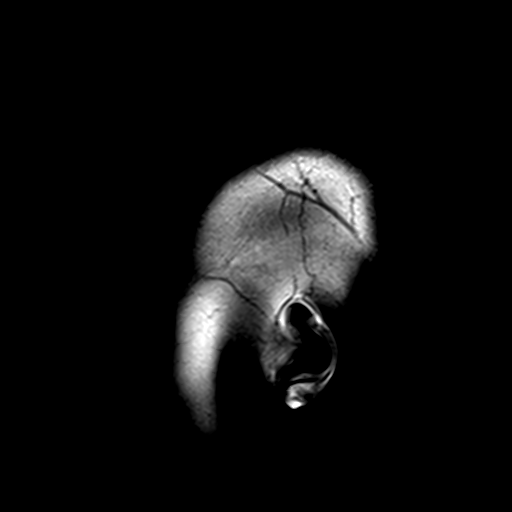

[Series 3: DWI · axial · 3.0mm · 1.88mm/px · z∈[-44,+127]mm · 8 of 116 slices shown (1 of 4)]
[im 1/116]
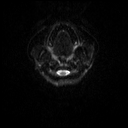
[im 17/116]
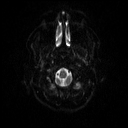
[im 33/116]
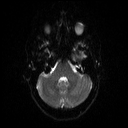
[im 50/116]
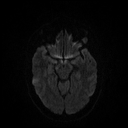
[im 66/116]
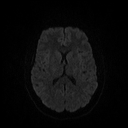
[im 83/116]
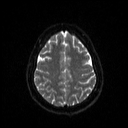
[im 99/116]
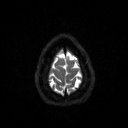
[im 116/116]
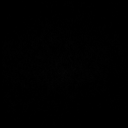

[Series 4: DWI · axial · 3.0mm · 1.88mm/px · z∈[-44,+127]mm · 3 of 57 slices shown (2 of 4)]
[im 1/57]
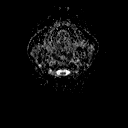
[im 29/57]
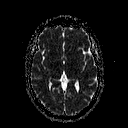
[im 57/57]
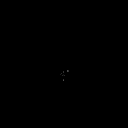

[Series 5: FLAIR · sagittal · 5.0mm · 0.45mm/px · 1 of 25 slices shown (1 of 2)]
[im 1/25]
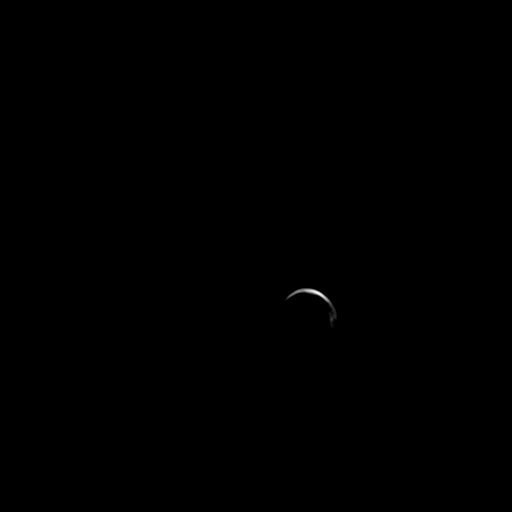

[Series 6: DWI · coronal · 5.0mm · 1.80mm/px · 5 of 82 slices shown (3 of 4)]
[im 1/82]
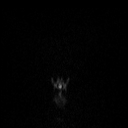
[im 21/82]
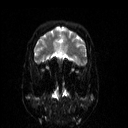
[im 41/82]
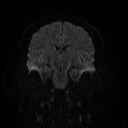
[im 61/82]
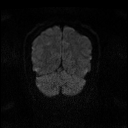
[im 82/82]
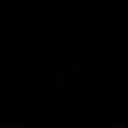

[Series 7: DWI · coronal · 5.0mm · 1.80mm/px · 2 of 41 slices shown (4 of 4)]
[im 1/41]
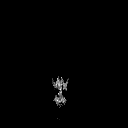
[im 41/41]
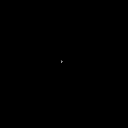

[Series 8: T2 · axial · 5.0mm · 0.62mm/px · z∈[-55,+126]mm · 2 of 28 slices shown (1 of 2)]
[im 1/28]
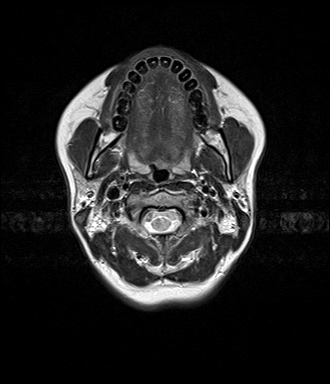
[im 28/28]
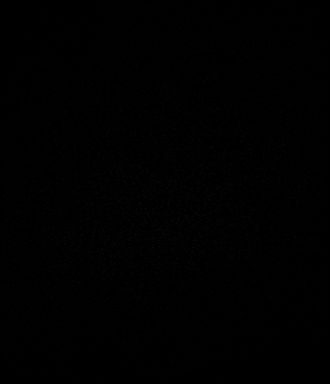

[Series 9: FLAIR · axial · 3.0mm · 0.45mm/px · z∈[-42,+130]mm · 2 of 38 slices shown (2 of 2)]
[im 1/38]
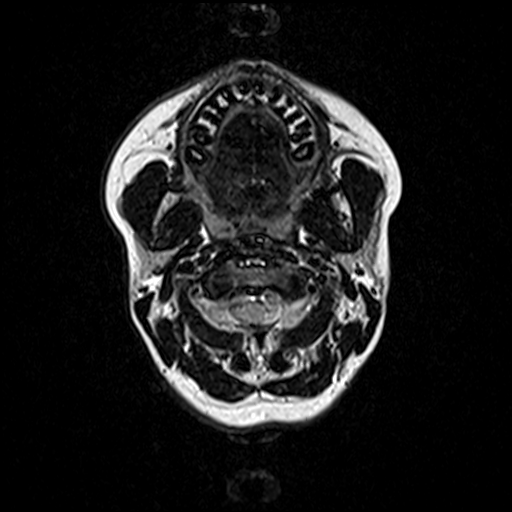
[im 38/38]
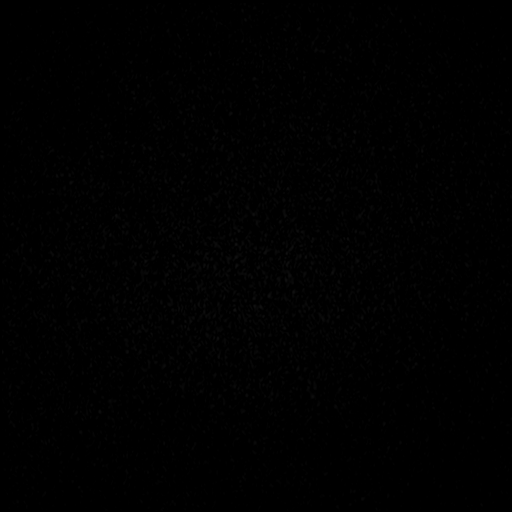

[Series 11: swi_images · axial · 4.0mm · 0.90mm/px · z∈[-36,+120]mm · 2 of 40 slices shown]
[im 1/40]
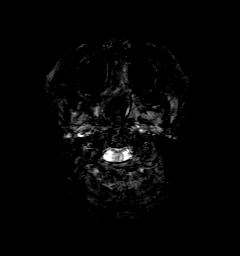
[im 40/40]
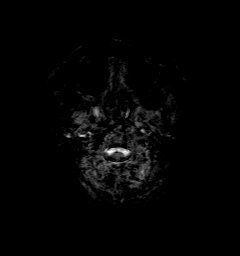

[Series 12: t1_mpr_tra · axial · 1.0mm · 0.75mm/px · z∈[-31,+112]mm · 8 of 144 slices shown]
[im 1/144]
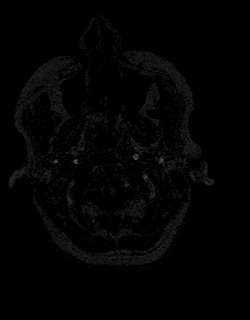
[im 21/144]
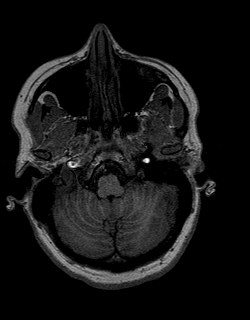
[im 41/144]
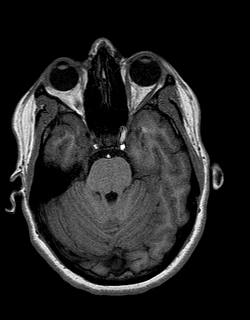
[im 62/144]
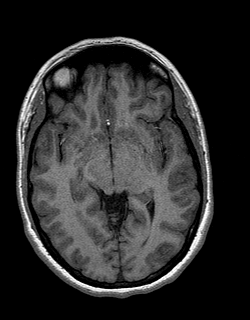
[im 82/144]
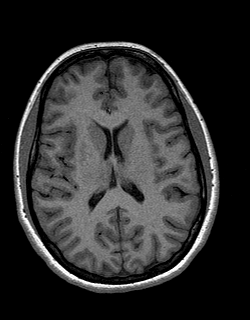
[im 103/144]
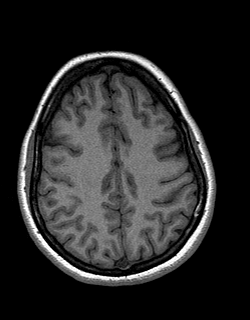
[im 123/144]
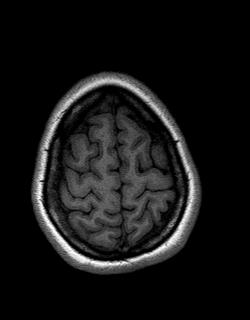
[im 144/144]
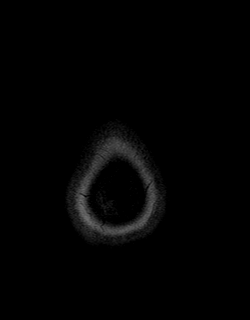

[Series 13: T2 · coronal · 5.0mm · 0.47mm/px · 2 of 32 slices shown (2 of 2)]
[im 1/32]
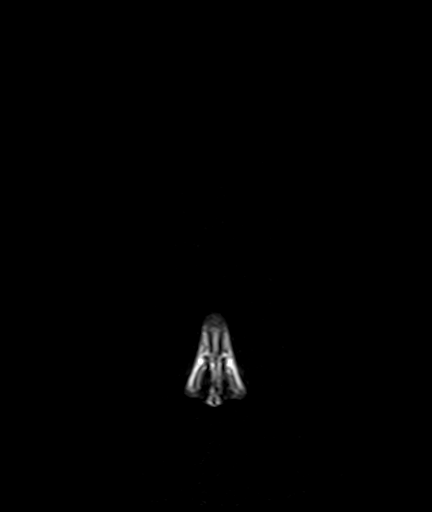
[im 32/32]
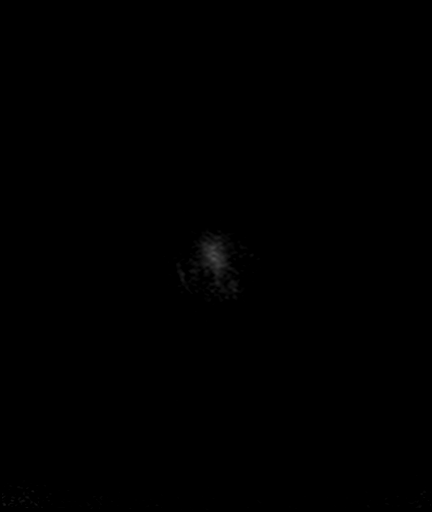

[Series 14: t1_mpr_tra post · axial · 1.0mm · 0.75mm/px · z∈[-31,+112]mm · 8 of 144 slices shown]
[im 1/144]
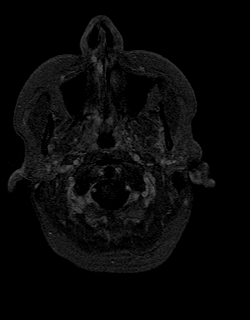
[im 21/144]
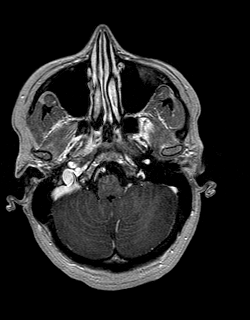
[im 41/144]
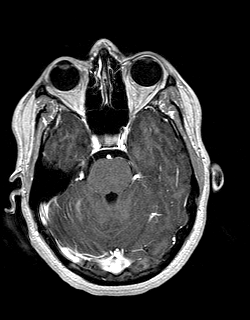
[im 62/144]
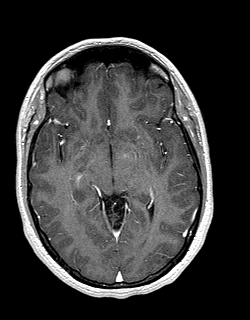
[im 82/144]
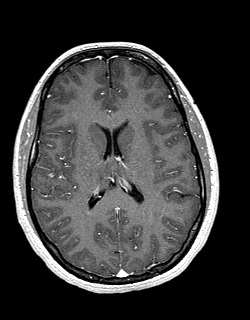
[im 103/144]
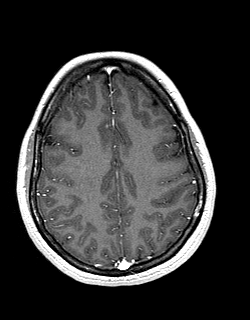
[im 123/144]
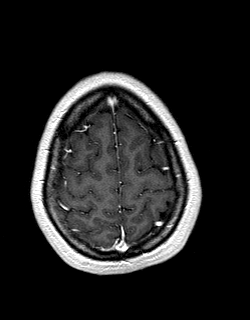
[im 144/144]
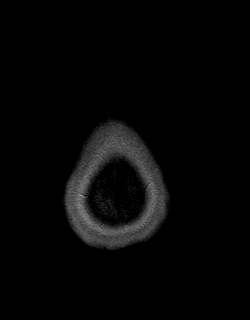

[Series 15: post cor · coronal · 5.0mm · 0.45mm/px · 2 of 32 slices shown]
[im 1/32]
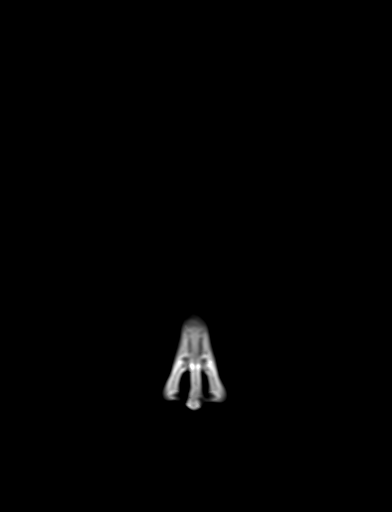
[im 32/32]
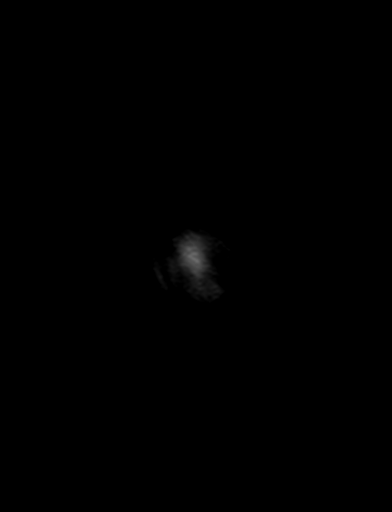

[Series 16: post sag (optional · sagittal · 5.0mm · 0.45mm/px · 1 of 25 slices shown]
[im 1/25]
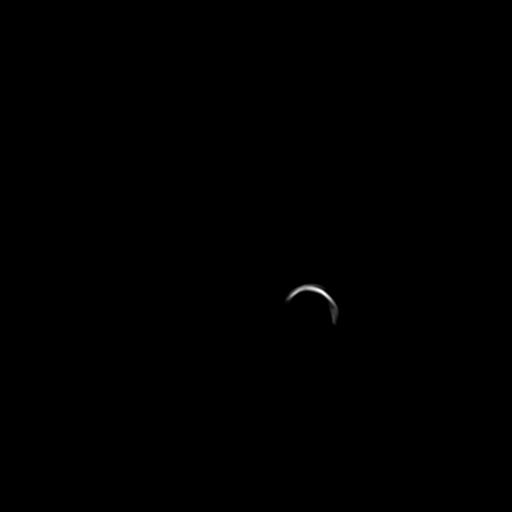

[48 of 48 positions shown; findings below may reference images not displayed]

FINDINGS: Brain:

Cerebral volume is normal.

No focal parenchymal signal abnormality is identified.

There is no acute infarct.

No evidence of an intracranial mass.

No chronic intracranial blood products.

No extra-axial fluid collection.

No midline shift.

No abnormal intracranial enhancement.

Vascular: Expected proximal arterial flow voids.

Skull and upper cervical spine: No focal marrow lesion.

Sinuses/Orbits: Visualized orbits show no acute finding. Trace
bilateral ethmoid sinus mucosal thickening.
IMPRESSION: Unremarkable MRI appearance of the brain. No evidence of acute
intracranial abnormality.

## 2020-09-23 MED ORDER — GADOBENATE DIMEGLUMINE 529 MG/ML IV SOLN
15.0000 mL | Freq: Once | INTRAVENOUS | Status: AC | PRN
  Administered 2020-09-23: 15 mL via INTRAVENOUS

## 2020-09-26 ENCOUNTER — Ambulatory Visit: Payer: 59 | Admitting: Neurology

## 2020-09-27 ENCOUNTER — Telehealth: Payer: Self-pay

## 2020-09-27 NOTE — Telephone Encounter (Signed)
-----   Message from Alda Berthold, DO sent at 09/26/2020 11:14 AM EDT ----- Please let pt know her MRI brain is normal.  No evidence of anything worrisome such as multiple sclerosis, stroke, or bleed.  Symptoms are most likely related to anxiety/stress reaction.

## 2020-09-27 NOTE — Telephone Encounter (Signed)
Called patient and informed her of results. Patient verbalized understanding and had no questions or concerns.  

## 2021-10-31 ENCOUNTER — Other Ambulatory Visit: Payer: Self-pay | Admitting: Physician Assistant

## 2021-10-31 DIAGNOSIS — H538 Other visual disturbances: Secondary | ICD-10-CM

## 2021-10-31 DIAGNOSIS — M542 Cervicalgia: Secondary | ICD-10-CM

## 2021-10-31 DIAGNOSIS — G44329 Chronic post-traumatic headache, not intractable: Secondary | ICD-10-CM

## 2021-10-31 DIAGNOSIS — Z87828 Personal history of other (healed) physical injury and trauma: Secondary | ICD-10-CM

## 2021-11-06 ENCOUNTER — Ambulatory Visit
Admission: RE | Admit: 2021-11-06 | Discharge: 2021-11-06 | Disposition: A | Discharge status: Home / Self Care | Payer: Managed Care, Other (non HMO) | Source: Ambulatory Visit | Attending: Physician Assistant | Admitting: Physician Assistant

## 2021-11-06 DIAGNOSIS — G44329 Chronic post-traumatic headache, not intractable: Secondary | ICD-10-CM

## 2021-11-06 DIAGNOSIS — H538 Other visual disturbances: Secondary | ICD-10-CM

## 2021-11-06 DIAGNOSIS — M542 Cervicalgia: Secondary | ICD-10-CM

## 2021-11-06 DIAGNOSIS — Z87828 Personal history of other (healed) physical injury and trauma: Secondary | ICD-10-CM

## 2021-11-06 IMAGING — MR MR HEAD W/O CM
11 series · 48 of 48 positions shown · non-contrast
Comparison: Brain MRI [DATE].

CLINICAL DATA: History of traumatic head injury. Chronic
posttraumatic headache, not intractable. Cervical pain. Blurred
vision. Additional history provided by scanning technologist:
Patient reports neck pain, brain fog, dizziness, headaches.

EXAM:
MRI HEAD WITHOUT CONTRAST
TECHNIQUE: Multiplanar, multiecho pulse sequences of the brain and surrounding
structures were obtained without intravenous contrast.

[Series 5: T1 · sagittal · 4.0mm · 0.75mm/px · 2 of 31 slices shown (1 of 2)]
[im 1/31]
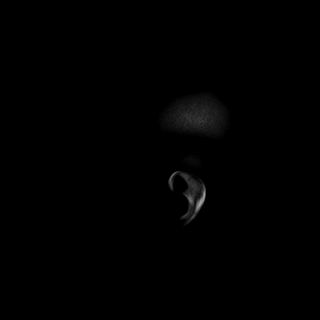
[im 31/31]
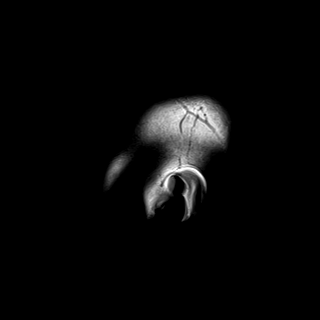

[Series 6: DWI · axial · 3.0mm · 0.94mm/px · z∈[-84,+69]mm · 11 of 180 slices shown (1 of 3)]
[im 1/180]
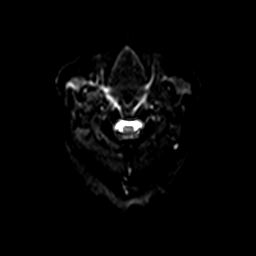
[im 18/180]
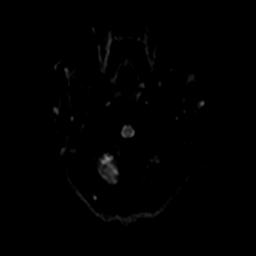
[im 36/180]
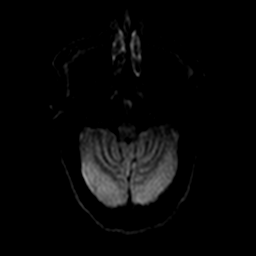
[im 54/180]
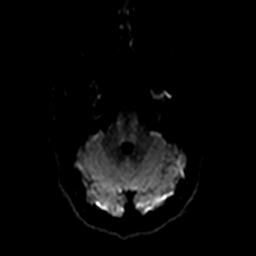
[im 72/180]
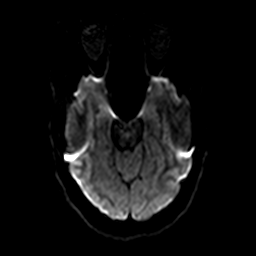
[im 90/180]
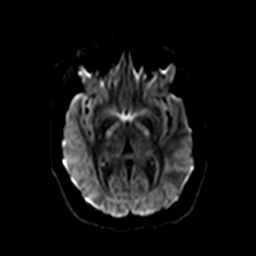
[im 108/180]
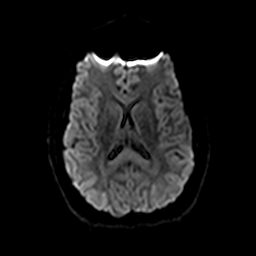
[im 126/180]
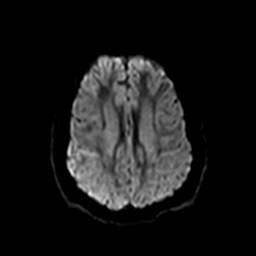
[im 144/180]
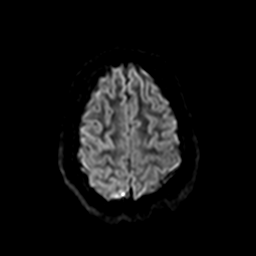
[im 162/180]
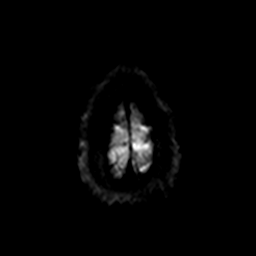
[im 180/180]
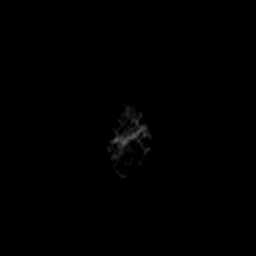

[Series 7: ax dwi_tracew · axial · 3.0mm · 0.94mm/px · z∈[-84,+69]mm · 6 of 90 slices shown]
[im 1/90]
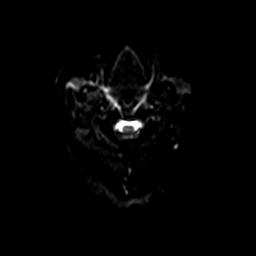
[im 18/90]
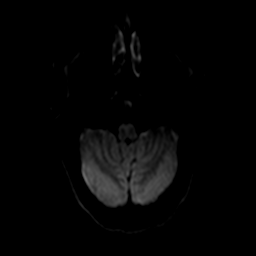
[im 36/90]
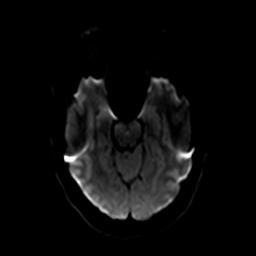
[im 54/90]
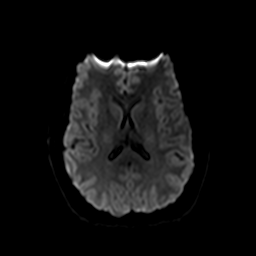
[im 72/90]
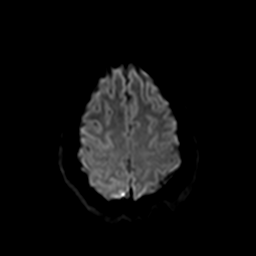
[im 90/90]
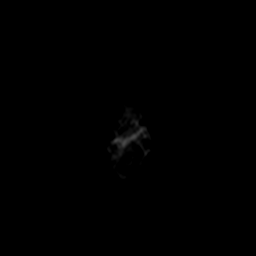

[Series 8: ax dwi_adc · axial · 3.0mm · 0.94mm/px · z∈[-84,+69]mm · 3 of 45 slices shown]
[im 1/45]
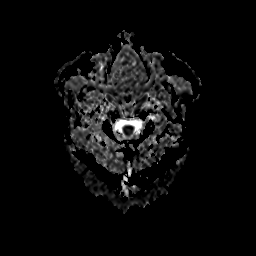
[im 23/45]
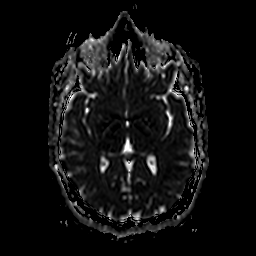
[im 45/45]
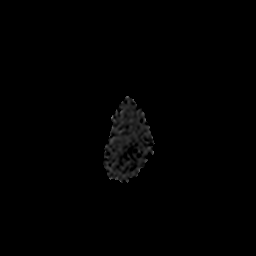

[Series 9: DWI · coronal · 5.0mm · 1.44mm/px · 4 of 68 slices shown (2 of 3)]
[im 1/68]
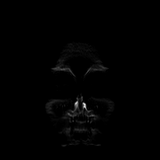
[im 23/68]
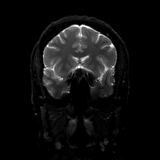
[im 45/68]
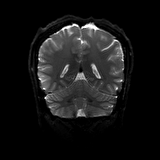
[im 68/68]
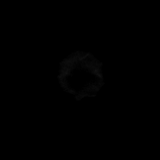

[Series 10: DWI · coronal · 5.0mm · 1.44mm/px · 2 of 34 slices shown (3 of 3)]
[im 1/34]
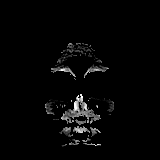
[im 34/34]
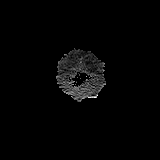

[Series 11: T2 · axial · 4.0mm · 0.36mm/px · z∈[-69,+80]mm · 2 of 30 slices shown (1 of 2)]
[im 1/30]
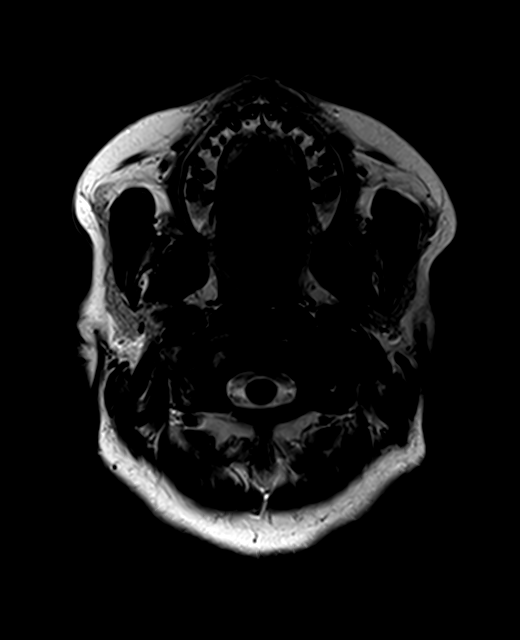
[im 30/30]
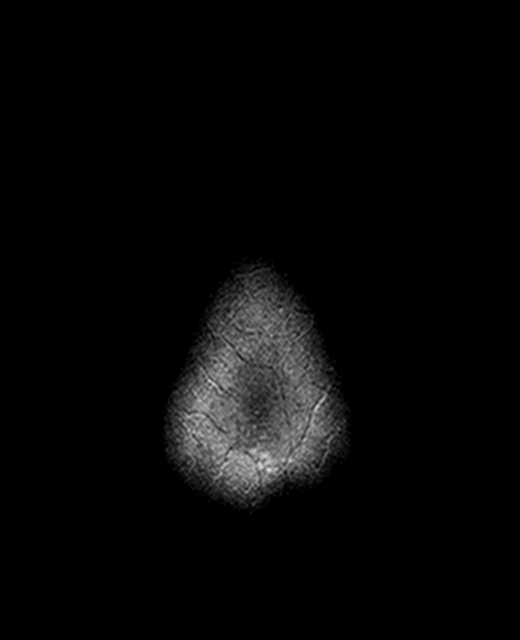

[Series 12: FLAIR · axial · 3.0mm · 0.72mm/px · z∈[-68,+79]mm · 2 of 26 slices shown]
[im 1/26]
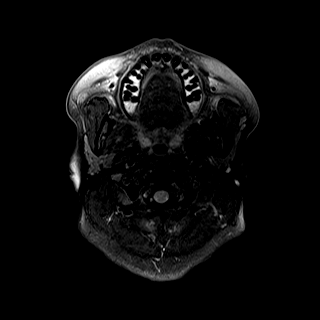
[im 26/26]
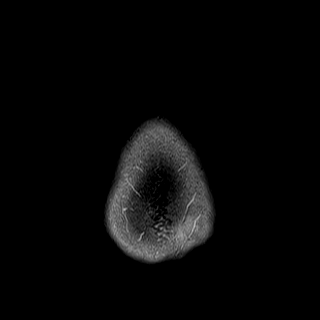

[Series 14: swi_images · axial · 3.0mm · 0.90mm/px · z∈[-88,+98]mm · 4 of 64 slices shown]
[im 1/64]
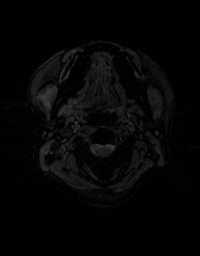
[im 22/64]
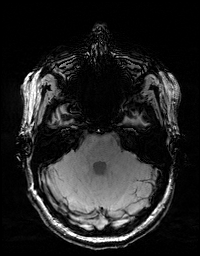
[im 43/64]
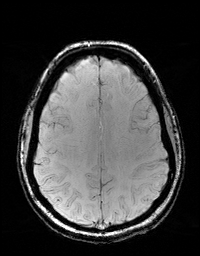
[im 64/64]
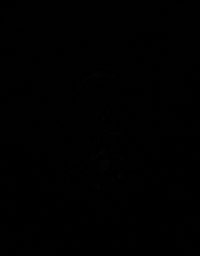

[Series 15: T1 · axial · 1.0mm · 0.90mm/px · z∈[-83,+72]mm · 10 of 160 slices shown (2 of 2)]
[im 1/160]
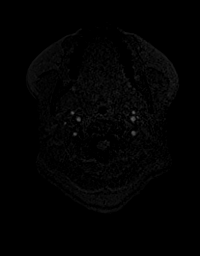
[im 18/160]
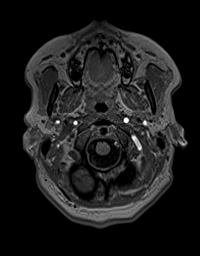
[im 36/160]
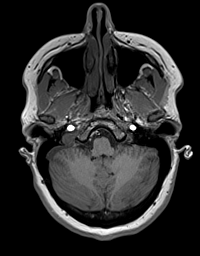
[im 54/160]
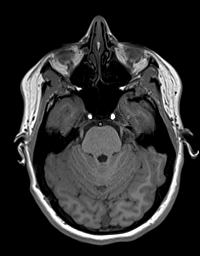
[im 71/160]
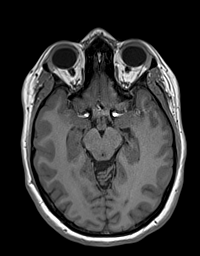
[im 89/160]
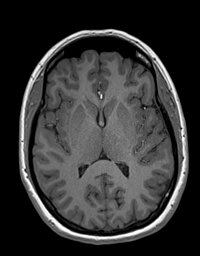
[im 107/160]
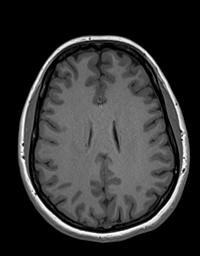
[im 124/160]
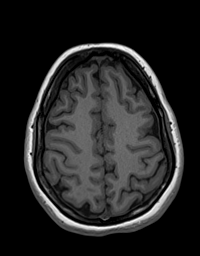
[im 142/160]
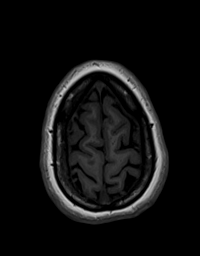
[im 160/160]
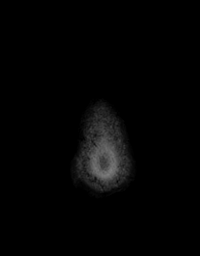

[Series 16: T2 · coronal · 4.5mm · 0.36mm/px · 2 of 33 slices shown (2 of 2)]
[im 1/33]
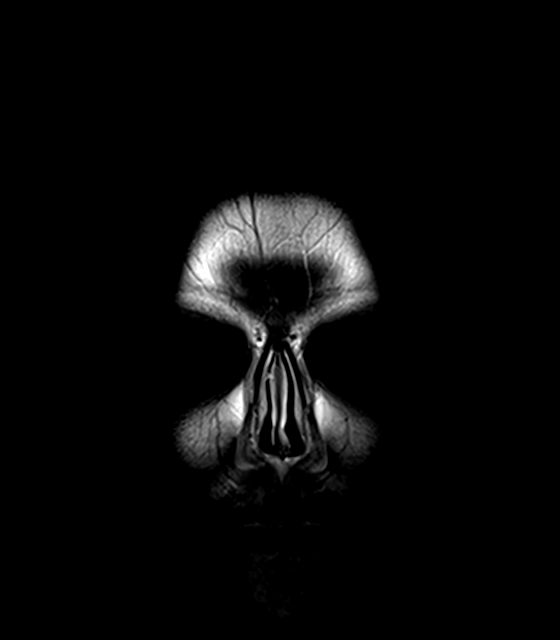
[im 33/33]
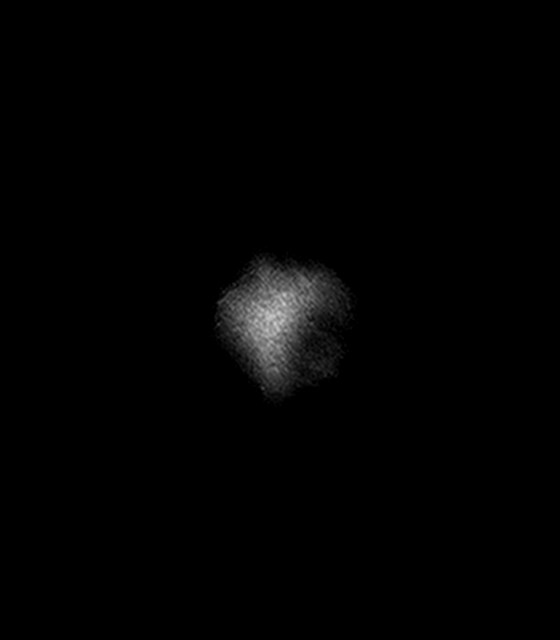

[48 of 48 positions shown; findings below may reference images not displayed]

FINDINGS: Brain:

Cerebral volume is normal.

No cortical encephalomalacia is identified. No significant cerebral
white matter disease.

There is no acute infarct.

No evidence of an intracranial mass.

No chronic intracranial blood products.

No extra-axial fluid collection.

No midline shift.

Vascular: Maintained flow voids within the proximal large arterial
vessels.

Skull and upper cervical spine: No focal suspicious marrow lesion.

Sinuses/Orbits: No mass or acute finding within the imaged orbits.
Trace mucosal thickening within the bilateral ethmoid sinuses.
IMPRESSION: Unremarkable non-contrast MRI appearance of the brain. No evidence
of acute intracranial abnormality.
